# Patient Record
Sex: Female | Born: 1955 | Race: Black or African American | Hispanic: No | Marital: Married | Smoking: Never smoker
Health system: Southern US, Community
[De-identification: ages and names within clinical notes are randomized; demographics above are authoritative.]

## PROBLEM LIST (undated history)

## (undated) DIAGNOSIS — I10 Essential (primary) hypertension: Secondary | ICD-10-CM

## (undated) DIAGNOSIS — E785 Hyperlipidemia, unspecified: Secondary | ICD-10-CM

## (undated) DIAGNOSIS — E119 Type 2 diabetes mellitus without complications: Secondary | ICD-10-CM

---

## 2019-12-16 ENCOUNTER — Ambulatory Visit: Payer: Self-pay | Attending: Internal Medicine

## 2019-12-16 DIAGNOSIS — Z23 Encounter for immunization: Secondary | ICD-10-CM

## 2019-12-16 NOTE — Progress Notes (Signed)
   Covid-19 Vaccination Clinic  Name:  Brooke Miles    MRN: [id number] DOB: June 05, 1956  12/16/2019  Brooke Miles was observed post Covid-19 immunization for 15 minutes without incident. She was provided with Vaccine Information Sheet and instruction to access the V-Safe system.   Brooke Miles was instructed to call 911 with any severe reactions post vaccine: Marland Kitchen Difficulty breathing  . Swelling of face and throat  . A fast heartbeat  . A bad rash all over body  . Dizziness and weakness   Immunizations Administered    Name Date Dose VIS Date Route   Pfizer COVID-19 Vaccine 12/16/2019 11:27 AM 0.3 mL 09/20/2018 Intramuscular   Manufacturer: [employer], [employer]   Lot: [id number]   NDC: [address]-8

## 2020-01-06 ENCOUNTER — Ambulatory Visit: Payer: Self-pay | Attending: Internal Medicine

## 2020-01-06 DIAGNOSIS — Z23 Encounter for immunization: Secondary | ICD-10-CM

## 2020-01-06 NOTE — Progress Notes (Signed)
   Covid-19 Vaccination Clinic  Name:  Brooke Miles    MRN: [id number] DOB: 06-Oct-1955  01/06/2020  [name] was observed post Covid-19 immunization for 15 minutes without incident. She was provided with Vaccine Information Sheet and instruction to access the V-Safe system.   [name] was instructed to call 911 with any severe reactions post vaccine: Marland Kitchen Difficulty breathing  . Swelling of face and throat  . A fast heartbeat  . A bad rash all over body  . Dizziness and weakness   Immunizations Administered    Name Date Dose VIS Date Route   Pfizer COVID-19 Vaccine 01/06/2020 11:45 AM 0.3 mL 09/20/2018 Intramuscular   Manufacturer: [employer], [employer]   Lot: [id number]   NDC: [address]-9

## 2021-04-25 ENCOUNTER — Emergency Department: Payer: Self-pay

## 2021-04-25 ENCOUNTER — Inpatient Hospital Stay
Admission: EM | Admit: 2021-04-25 | Discharge: 2021-04-29 | DRG: 394 | Disposition: A | Discharge status: Home / Self Care | Payer: Self-pay | Attending: Family Medicine | Admitting: Family Medicine

## 2021-04-25 ENCOUNTER — Other Ambulatory Visit: Payer: Self-pay

## 2021-04-25 DIAGNOSIS — R7989 Other specified abnormal findings of blood chemistry: Secondary | ICD-10-CM | POA: Diagnosis present

## 2021-04-25 DIAGNOSIS — E1159 Type 2 diabetes mellitus with other circulatory complications: Secondary | ICD-10-CM | POA: Diagnosis present

## 2021-04-25 DIAGNOSIS — Z20822 Contact with and (suspected) exposure to covid-19: Secondary | ICD-10-CM | POA: Diagnosis present

## 2021-04-25 DIAGNOSIS — Z881 Allergy status to other antibiotic agents status: Secondary | ICD-10-CM

## 2021-04-25 DIAGNOSIS — A419 Sepsis, unspecified organism: Secondary | ICD-10-CM

## 2021-04-25 DIAGNOSIS — E1169 Type 2 diabetes mellitus with other specified complication: Secondary | ICD-10-CM | POA: Diagnosis present

## 2021-04-25 DIAGNOSIS — I248 Other forms of acute ischemic heart disease: Secondary | ICD-10-CM | POA: Diagnosis present

## 2021-04-25 DIAGNOSIS — E876 Hypokalemia: Secondary | ICD-10-CM | POA: Diagnosis present

## 2021-04-25 DIAGNOSIS — I429 Cardiomyopathy, unspecified: Secondary | ICD-10-CM | POA: Diagnosis present

## 2021-04-25 DIAGNOSIS — E860 Dehydration: Secondary | ICD-10-CM | POA: Diagnosis present

## 2021-04-25 DIAGNOSIS — K559 Vascular disorder of intestine, unspecified: Principal | ICD-10-CM | POA: Diagnosis present

## 2021-04-25 DIAGNOSIS — J189 Pneumonia, unspecified organism: Secondary | ICD-10-CM

## 2021-04-25 DIAGNOSIS — R6520 Severe sepsis without septic shock: Secondary | ICD-10-CM

## 2021-04-25 DIAGNOSIS — I152 Hypertension secondary to endocrine disorders: Secondary | ICD-10-CM | POA: Diagnosis present

## 2021-04-25 DIAGNOSIS — I4891 Unspecified atrial fibrillation: Secondary | ICD-10-CM | POA: Diagnosis present

## 2021-04-25 DIAGNOSIS — I5022 Chronic systolic (congestive) heart failure: Secondary | ICD-10-CM | POA: Diagnosis present

## 2021-04-25 DIAGNOSIS — E785 Hyperlipidemia, unspecified: Secondary | ICD-10-CM | POA: Diagnosis present

## 2021-04-25 DIAGNOSIS — G9341 Metabolic encephalopathy: Secondary | ICD-10-CM | POA: Diagnosis present

## 2021-04-25 DIAGNOSIS — N390 Urinary tract infection, site not specified: Secondary | ICD-10-CM | POA: Diagnosis present

## 2021-04-25 DIAGNOSIS — E1165 Type 2 diabetes mellitus with hyperglycemia: Secondary | ICD-10-CM | POA: Diagnosis present

## 2021-04-25 DIAGNOSIS — E872 Acidosis, unspecified: Secondary | ICD-10-CM | POA: Diagnosis present

## 2021-04-25 DIAGNOSIS — E871 Hypo-osmolality and hyponatremia: Secondary | ICD-10-CM | POA: Diagnosis present

## 2021-04-25 DIAGNOSIS — I2510 Atherosclerotic heart disease of native coronary artery without angina pectoris: Secondary | ICD-10-CM | POA: Diagnosis present

## 2021-04-25 DIAGNOSIS — E119 Type 2 diabetes mellitus without complications: Secondary | ICD-10-CM

## 2021-04-25 DIAGNOSIS — R93429 Abnormal radiologic findings on diagnostic imaging of unspecified kidney: Secondary | ICD-10-CM | POA: Diagnosis present

## 2021-04-25 DIAGNOSIS — K529 Noninfective gastroenteritis and colitis, unspecified: Secondary | ICD-10-CM

## 2021-04-25 DIAGNOSIS — I083 Combined rheumatic disorders of mitral, aortic and tricuspid valves: Secondary | ICD-10-CM | POA: Diagnosis present

## 2021-04-25 HISTORY — DX: Type 2 diabetes mellitus without complications: E11.9

## 2021-04-25 HISTORY — DX: Essential (primary) hypertension: I10

## 2021-04-25 HISTORY — DX: Hyperlipidemia, unspecified: E78.5

## 2021-04-25 LAB — COMPREHENSIVE METABOLIC PANEL
ALT: 17 U/L (ref 0–44)
AST: 31 U/L (ref 15–41)
Albumin: 3.8 g/dL (ref 3.5–5.0)
Alkaline Phosphatase: 67 U/L (ref 38–126)
Anion gap: 15 (ref 5–15)
BUN: 27 mg/dL — ABNORMAL HIGH (ref 8–23)
CO2: 29 mmol/L (ref 22–32)
Calcium: 10.2 mg/dL (ref 8.9–10.3)
Chloride: 88 mmol/L — ABNORMAL LOW (ref 98–111)
Creatinine, Ser: 1.03 mg/dL — ABNORMAL HIGH (ref 0.44–1.00)
GFR, Estimated: >60 mL/min (ref >60)
Glucose, Bld: 317 mg/dL — ABNORMAL HIGH (ref 70–99)
Potassium: 2.8 mmol/L — ABNORMAL LOW (ref 3.5–5.1)
Sodium: 132 mmol/L — ABNORMAL LOW (ref 135–145)
Total Bilirubin: 1.2 mg/dL (ref 0.3–1.2)
Total Protein: 8.4 g/dL — ABNORMAL HIGH (ref 6.5–8.1)

## 2021-04-25 LAB — CBC
HCT: 47.4 % — ABNORMAL HIGH (ref 36.0–46.0)
Hemoglobin: 16.9 g/dL — ABNORMAL HIGH (ref 12.0–15.0)
MCH: 29.1 pg (ref 26.0–34.0)
MCHC: 35.7 g/dL (ref 30.0–36.0)
MCV: 81.7 fL (ref 80.0–100.0)
Platelets: 402 10*3/uL — ABNORMAL HIGH (ref 150–400)
RBC: 5.8 MIL/uL — ABNORMAL HIGH (ref 3.87–5.11)
RDW: 13.6 % (ref 11.5–15.5)
WBC: 16.2 10*3/uL — ABNORMAL HIGH (ref 4.0–10.5)
nRBC: 0 % (ref 0.0–0.2)

## 2021-04-25 LAB — TROPONIN I (HIGH SENSITIVITY): Troponin I (High Sensitivity): 59 ng/L — ABNORMAL HIGH (ref <18)

## 2021-04-25 LAB — LIPASE, BLOOD: Lipase: 36 U/L (ref 11–51)

## 2021-04-25 LAB — RESP PANEL BY RT-PCR (FLU A&B, COVID) ARPGX2
Influenza A by PCR: NEGATIVE
Influenza B by PCR: NEGATIVE
SARS Coronavirus 2 by RT PCR: NEGATIVE

## 2021-04-25 LAB — PROTIME-INR
INR: 1.2 (ref 0.8–1.2)
Prothrombin Time: 15 seconds (ref 11.4–15.2)

## 2021-04-25 LAB — GLUCOSE, CAPILLARY: Glucose-Capillary: 224 mg/dL — ABNORMAL HIGH (ref 70–99)

## 2021-04-25 LAB — T4, FREE: Free T4: 1.13 ng/dL — ABNORMAL HIGH (ref 0.61–1.12)

## 2021-04-25 LAB — MAGNESIUM: Magnesium: 1.5 mg/dL — ABNORMAL LOW (ref 1.7–2.4)

## 2021-04-25 LAB — LACTIC ACID, PLASMA
Lactic Acid, Venous: 2.9 mmol/L (ref 0.5–1.9)
Lactic Acid, Venous: 2.2 mmol/L (ref 0.5–1.9)

## 2021-04-25 LAB — APTT: aPTT: 27 seconds (ref 24–36)

## 2021-04-25 LAB — TSH: TSH: 0.842 u[IU]/mL (ref 0.350–4.500)

## 2021-04-25 MED ORDER — ONDANSETRON HCL 4 MG PO TABS: 4.0000 mg | ORAL_TABLET | Freq: Four times a day (QID) | ORAL | Status: DC | PRN

## 2021-04-25 MED ORDER — SODIUM CHLORIDE 0.9% FLUSH
3.0000 mL | Freq: Two times a day (BID) | INTRAVENOUS | Status: DC
  Administered 2021-04-26 – 2021-04-29 (×6): 3 mL via INTRAVENOUS

## 2021-04-25 MED ORDER — POTASSIUM CHLORIDE IN NACL 40-0.9 MEQ/L-% IV SOLN
INTRAVENOUS | Status: AC
  Filled 2021-04-25 (×3): qty 1000

## 2021-04-25 MED ORDER — IOHEXOL 350 MG/ML SOLN
75.0000 mL | Freq: Once | INTRAVENOUS | Status: AC | PRN
  Administered 2021-04-25: 75 mL via INTRAVENOUS

## 2021-04-25 MED ORDER — METOPROLOL TARTRATE 5 MG/5ML IV SOLN
5.0000 mg | Freq: Four times a day (QID) | INTRAVENOUS | Status: DC | PRN
  Administered 2021-04-26: 5 mg via INTRAVENOUS
  Filled 2021-04-25: qty 5

## 2021-04-25 MED ORDER — POTASSIUM CHLORIDE 10 MEQ/100ML IV SOLN
10.0000 meq | INTRAVENOUS | Status: AC
Start: 2021-04-25 — End: 2021-04-26
  Administered 2021-04-25 – 2021-04-26 (×2): 10 meq via INTRAVENOUS
  Filled 2021-04-25 (×2): qty 100

## 2021-04-25 MED ORDER — SODIUM CHLORIDE 0.9 % IV BOLUS
1000.0000 mL | Freq: Once | INTRAVENOUS | Status: AC
  Administered 2021-04-25: 1000 mL via INTRAVENOUS

## 2021-04-25 MED ORDER — FENTANYL CITRATE PF 50 MCG/ML IJ SOSY
50.0000 ug | PREFILLED_SYRINGE | Freq: Once | INTRAMUSCULAR | Status: DC
  Filled 2021-04-25: qty 1

## 2021-04-25 MED ORDER — MAGNESIUM SULFATE 2 GM/50ML IV SOLN
2.0000 g | Freq: Once | INTRAVENOUS | Status: AC
  Administered 2021-04-25: 2 g via INTRAVENOUS
  Filled 2021-04-25: qty 50

## 2021-04-25 MED ORDER — HEPARIN (PORCINE) 25000 UT/250ML-% IV SOLN
1250.0000 [IU]/h | INTRAVENOUS | Status: DC
  Administered 2021-04-25: 1250 [IU]/h via INTRAVENOUS
  Filled 2021-04-25: qty 250

## 2021-04-25 MED ORDER — ALBUMIN HUMAN 25 % IV SOLN
25.0000 g | Freq: Once | INTRAVENOUS | Status: DC
  Filled 2021-04-25 (×2): qty 100

## 2021-04-25 MED ORDER — FENTANYL CITRATE PF 50 MCG/ML IJ SOSY
25.0000 ug | PREFILLED_SYRINGE | Freq: Once | INTRAMUSCULAR | Status: AC
  Administered 2021-04-25: 25 ug via INTRAVENOUS
  Filled 2021-04-25: qty 1

## 2021-04-25 MED ORDER — ONDANSETRON HCL 4 MG/2ML IJ SOLN
4.0000 mg | Freq: Four times a day (QID) | INTRAMUSCULAR | Status: DC | PRN
  Administered 2021-04-28: 4 mg via INTRAVENOUS
  Filled 2021-04-25: qty 2

## 2021-04-25 MED ORDER — CEFTRIAXONE SODIUM 2 G IJ SOLR
2.0000 g | INTRAMUSCULAR | Status: DC
  Filled 2021-04-25: qty 20

## 2021-04-25 MED ORDER — HEPARIN BOLUS VIA INFUSION
5000.0000 [IU] | Freq: Once | INTRAVENOUS | Status: AC
  Administered 2021-04-25: 5000 [IU] via INTRAVENOUS
  Filled 2021-04-25: qty 5000

## 2021-04-25 MED ORDER — MORPHINE SULFATE (PF) 2 MG/ML IV SOLN: 1.0000 mg | INTRAVENOUS | Status: DC | PRN

## 2021-04-25 MED ORDER — SODIUM CHLORIDE 0.9 % IV BOLUS
500.0000 mL | Freq: Once | INTRAVENOUS | Status: AC
  Administered 2021-04-25: 500 mL via INTRAVENOUS

## 2021-04-25 MED ORDER — DILTIAZEM HCL 25 MG/5ML IV SOLN
5.0000 mg | Freq: Once | INTRAVENOUS | Status: AC
  Administered 2021-04-25: 5 mg via INTRAVENOUS
  Filled 2021-04-25: qty 5

## 2021-04-25 MED ORDER — SODIUM CHLORIDE 0.9 % IV SOLN
INTRAVENOUS | Status: DC | PRN
  Administered 2021-04-25: 250 mL via INTRAVENOUS

## 2021-04-25 MED ORDER — INSULIN ASPART 100 UNIT/ML IJ SOLN
0.0000 [IU] | INTRAMUSCULAR | Status: DC
  Administered 2021-04-25: 5 [IU] via SUBCUTANEOUS
  Administered 2021-04-26: 2 [IU] via SUBCUTANEOUS
  Administered 2021-04-26 (×2): 5 [IU] via SUBCUTANEOUS
  Administered 2021-04-26: 3 [IU] via SUBCUTANEOUS
  Administered 2021-04-26: 5 [IU] via SUBCUTANEOUS
  Administered 2021-04-26: 2 [IU] via SUBCUTANEOUS
  Administered 2021-04-27: 11 [IU] via SUBCUTANEOUS
  Administered 2021-04-27 (×3): 2 [IU] via SUBCUTANEOUS
  Administered 2021-04-27: 8 [IU] via SUBCUTANEOUS
  Filled 2021-04-25 (×12): qty 1

## 2021-04-25 MED ORDER — SODIUM CHLORIDE 0.9 % IV SOLN
500.0000 mg | INTRAVENOUS | Status: DC
  Filled 2021-04-25: qty 500

## 2021-04-25 MED ORDER — DILTIAZEM HCL 60 MG PO TABS
60.0000 mg | ORAL_TABLET | Freq: Once | ORAL | Status: AC
  Administered 2021-04-25: 60 mg via ORAL
  Filled 2021-04-25: qty 1

## 2021-04-25 MED ORDER — PIPERACILLIN-TAZOBACTAM 3.375 G IVPB
3.3750 g | Freq: Three times a day (TID) | INTRAVENOUS | Status: DC
  Administered 2021-04-25 – 2021-04-26 (×2): 3.375 g via INTRAVENOUS
  Filled 2021-04-25 (×2): qty 50

## 2021-04-25 MED ORDER — POTASSIUM CHLORIDE 10 MEQ/100ML IV SOLN
10.0000 meq | INTRAVENOUS | Status: AC
  Administered 2021-04-25 (×2): 10 meq via INTRAVENOUS
  Filled 2021-04-25 (×2): qty 100

## 2021-04-25 MED ORDER — PIPERACILLIN-TAZOBACTAM 3.375 G IVPB 30 MIN
3.3750 g | Freq: Once | INTRAVENOUS | Status: AC
  Administered 2021-04-25: 3.375 g via INTRAVENOUS
  Filled 2021-04-25: qty 50

## 2021-04-25 MED ORDER — ONDANSETRON HCL 4 MG/2ML IJ SOLN
4.0000 mg | Freq: Once | INTRAMUSCULAR | Status: AC
  Administered 2021-04-25: 4 mg via INTRAVENOUS
  Filled 2021-04-25: qty 2

## 2021-04-25 NOTE — Consult Note (Signed)
CODE SEPSIS - PHARMACY COMMUNICATION  **Broad Spectrum Antibiotics should be administered within 1 hour of Sepsis diagnosis**  Time Code Sepsis Called/Page Received: 1722  Antibiotics Ordered:  Azithromycin ceftriaxone  Time of 1st antibiotic administration: after 1hr  Additional action taken by pharmacy: messaged RN  If necessary, Name of Provider/Nurse Contacted: K Black   Brooke Miles PharmD, BCPS 04/25/2021 5:51 PM

## 2021-04-25 NOTE — ED Provider Notes (Signed)
[facility] Emergency Department Provider Note  ____________________________________________   Event Date/Time   First MD Initiated Contact with Patient 04/25/21 1501     (approximate)  I have reviewed the triage vital signs and the nursing notes.   HISTORY  Chief Complaint Abdominal Pain    HPI Brooke Miles is a 65 y.o. female with diabetes who comes in with concerns for upper abdominal pain, nausea, vomiting since yesterday morning.  Patient comes in with her daughter in law who would prefer to translate for patient.  Patient reportedly has had some abdominal pain that started yesterday.  Initially was in the upper abdomen but now down to the lower abdomen as well, severe, constant, nothing makes it better or worse.  Associated with 2 episodes of nonbloody nonbilious vomiting.  Denies any diarrhea.  Denies any chest pain or shortness of breath associated with it.  Denies any history of elevated heart rates.  Has only had a prior C-section.            Past Medical History:  Diagnosis Date   Diabetes mellitus without complication (HCC)    Hyperlipidemia    Hypertension     There are no problems to display for this patient.   Past Surgical History:  Procedure Laterality Date   CESAREAN SECTION      Prior to Admission medications   Not on File    Allergies Quinolones  No family history on file.  Social History Social History   Tobacco Use   Smoking status: Never   Smokeless tobacco: Never  Substance Use Topics   Alcohol use: Not Currently      Review of Systems Constitutional: No fever/chills Eyes: No visual changes. ENT: No sore throat. Cardiovascular: Denies chest pain. Respiratory: Denies shortness of breath. Gastrointestinal: Abdominal pain, nausea, vomiting Genitourinary: Negative for dysuria. Musculoskeletal: Negative for back pain. Skin: Negative for rash. Neurological: Negative for headaches, focal weakness or  numbness. All other ROS negative ____________________________________________   PHYSICAL EXAM:  VITAL SIGNS: ED Triage Vitals  Enc Vitals Group     BP 04/25/21 1234 (!) 145/95     Pulse Rate 04/25/21 1234 (!) 58     Resp 04/25/21 1234 16     Temp 04/25/21 1235 97.7 F (36.5 C)     Temp Source 04/25/21 1235 Oral     SpO2 04/25/21 1234 95 %     Weight 04/25/21 1236 220 lb (99.8 kg)     Height 04/25/21 1236 5\' 8"  (1.727 m)     Head Circumference --      Peak Flow --      Pain Score 04/25/21 1236 5     Pain Loc --      Pain Edu? --      Excl. in GC? --     Constitutional: Alert and oriented. Well appearing and in no acute distress. Eyes: Conjunctivae are normal. EOMI. Head: Atraumatic. Nose: No congestion/rhinnorhea. Mouth/Throat: Mucous membranes are moist.   Neck: No stridor. Trachea Midline. FROM Cardiovascular: Irregular, tachycardic grossly normal heart sounds.  Good peripheral circulation. Respiratory: Normal respiratory effort.  No retractions. Lungs CTAB. Gastrointestinal: Tender in the abdomen.  Most in the upper but also a little bit in the right lower.  No distention. No abdominal bruits.  Musculoskeletal: No lower extremity tenderness nor edema.  No joint effusions. Neurologic:  Normal speech and language. No gross focal neurologic deficits are appreciated.  Skin:  Skin is warm, dry and intact. No rash  noted. Psychiatric: Mood and affect are normal. Speech and behavior are normal. GU: Deferred   ____________________________________________   LABS (all labs ordered are listed, but only abnormal results are displayed)  Labs Reviewed  COMPREHENSIVE METABOLIC PANEL - Abnormal; Notable for the following components:      Result Value   Sodium 132 (*)    Potassium 2.8 (*)    Chloride 88 (*)    Glucose, Bld 317 (*)    BUN 27 (*)    Creatinine, Ser 1.03 (*)    Total Protein 8.4 (*)    All other components within normal limits  CBC - Abnormal; Notable for the  following components:   WBC 16.2 (*)    RBC 5.80 (*)    Hemoglobin 16.9 (*)    HCT 47.4 (*)    Platelets 402 (*)    All other components within normal limits  URINALYSIS, COMPLETE (UACMP) WITH MICROSCOPIC - Abnormal; Notable for the following components:   Color, Urine YELLOW (*)    APPearance HAZY (*)    Glucose, UA 150 (*)    Hgb urine dipstick SMALL (*)    Ketones, ur 5 (*)    Protein, ur >=300 (*)    All other components within normal limits  LIPASE, BLOOD   ____________________________________________   ED ECG REPORT I, Concha Se, the attending physician, personally viewed and interpreted this ECG.  A. fib with a rate of 128, no ST elevation, T wave inversions aVL, T4-T6, normal intervals   ____________________________________________  RADIOLOGY   Official radiology report(s): CT Angio Chest PE W and/or Wo Contrast  Result Date: 04/25/2021 CLINICAL DATA:  PE suspected EXAM: CT ANGIOGRAPHY CHEST WITH CONTRAST TECHNIQUE: Multidetector CT imaging of the chest was performed using the standard protocol during bolus administration of intravenous contrast. Multiplanar CT image reconstructions and MIPs were obtained to evaluate the vascular anatomy. CONTRAST:  61mL OMNIPAQUE IOHEXOL 350 MG/ML SOLN COMPARISON:  None. FINDINGS: Cardiovascular: Satisfactory opacification of the pulmonary arteries to the segmental level. No evidence of pulmonary embolism. Cardiomegaly. Left coronary artery calcifications. No pericardial effusion. Mediastinum/Nodes: No enlarged mediastinal, hilar, or axillary lymph nodes. Thyroid gland, trachea, and esophagus demonstrate no significant findings. Lungs/Pleura: Nonspecific infectious or inflammatory ground-glass airspace disease of the right lung base (series 7, image 57). No pleural effusion or pneumothorax. Upper Abdomen: Please see separately reported examination of the abdomen and pelvis. Musculoskeletal: No chest wall abnormality. No acute or  significant osseous findings. Review of the MIP images confirms the above findings. IMPRESSION: 1. Negative examination for pulmonary embolism. 2. Nonspecific infectious or inflammatory ground-glass airspace disease of the right lung base. 3. Cardiomegaly and coronary artery disease. Electronically Signed   By: Jearld Lesch M.D.   On: 04/25/2021 17:04   CT ABDOMEN PELVIS W CONTRAST  Result Date: 04/25/2021 CLINICAL DATA:  Abdominal distension, upper abdominal pain, nausea, vomiting EXAM: CT ABDOMEN AND PELVIS WITH CONTRAST TECHNIQUE: Multidetector CT imaging of the abdomen and pelvis was performed using the standard protocol following bolus administration of intravenous contrast. CONTRAST:  41mL OMNIPAQUE IOHEXOL 350 MG/ML SOLN COMPARISON:  None. FINDINGS: Lower chest: No acute abnormality. Hepatobiliary: No solid liver abnormality is seen. No gallstones, gallbladder wall thickening, or biliary dilatation. Pancreas: Unremarkable. No pancreatic ductal dilatation or surrounding inflammatory changes. Spleen: Normal in size without significant abnormality. Adrenals/Urinary Tract: Adrenal glands are unremarkable. There are multiple hypoenhancing defects of the inferior pole of the right kidney (series 6, image 53). The left kidney is normal, without renal  calculi, solid lesion, or hydronephrosis. Bladder is unremarkable. Stomach/Bowel: Stomach is within normal limits. Appendix appears normal. There are multiple thickened loops of mid to distal small bowel in the low abdomen and pelvis (series 3, image 77). Notably, the terminal ileum is spared. Vascular/Lymphatic: Aortic atherosclerosis. No enlarged abdominal or pelvic lymph nodes. Reproductive: No mass or other significant abnormality. Other: No abdominal wall hernia or abnormality. No abdominopelvic ascites. Musculoskeletal: No acute or significant osseous findings. IMPRESSION: 1. There are multiple thickened loops of mid to distal small bowel in the low abdomen  and pelvis. Notably, the terminal ileum is spared. Findings are consistent with nonspecific infectious, inflammatory, or ischemic enteritis. 2. There are multiple hypoenhancing defects of the inferior pole of the right kidney, this appearance suggesting either pyelonephritis or perhaps infarction. Correlate with urinalysis. 3. Thromboembolic shower is a general differential consideration that could produce right kidney findings and small-bowel findings, and central source could be further investigated by echocardiography if desired. Aortic Atherosclerosis (ICD10-I70.0). Electronically Signed   By: Jearld Lesch M.D.   On: 04/25/2021 17:10    ____________________________________________   PROCEDURES  Procedure(s) performed (including Critical Care):  .1-3 Lead EKG Interpretation Performed by: Concha Se, MD Authorized by: Concha Se, MD     Interpretation: abnormal     ECG rate:  100s   ECG rate assessment: tachycardic     Rhythm: atrial fibrillation     Ectopy: none     Conduction: normal   .Critical Care Performed by: Concha Se, MD Authorized by: Concha Se, MD   Critical care provider statement:    Critical care time (minutes):  45   Critical care was necessary to treat or prevent imminent or life-threatening deterioration of the following conditions:  Sepsis and cardiac failure   Critical care was time spent personally by me on the following activities:  Discussions with consultants, evaluation of patient's response to treatment, examination of patient, ordering and performing treatments and interventions, ordering and review of laboratory studies, ordering and review of radiographic studies, pulse oximetry, re-evaluation of patient's condition, obtaining history from patient or surrogate and review of old charts   ____________________________________________   INITIAL IMPRESSION / ASSESSMENT AND PLAN / ED COURSE  Brooke Miles was evaluated in Emergency Department on  04/25/2021 for the symptoms described in the history of present illness. She was evaluated in the context of the global COVID-19 pandemic, which necessitated consideration that the patient might be at risk for infection with the SARS-CoV-2 virus that causes COVID-19. Institutional protocols and algorithms that pertain to the evaluation of patients at risk for COVID-19 are in a state of rapid change based on information released by regulatory bodies including the CDC and federal and state organizations. These policies and algorithms were followed during the patient's care in the ED.    Patient comes in with abdominal pain with nausea, vomiting.  Will get COVID test.  We will labs to evaluate for Electra abnormalities, AKI, UTI.  Patient noted to be in A. fib with RVR on initial EKG.  Once we placed her on the monitor her heart rates were lower in the 110s.  We will get a CT PE given patient does have some lower oxygen levels 90 to 93% given new onset A. fib as well as CT abdomen to make sure no evidence of appendicitis, perforation, cholecystitis or other acute pathology.   Labs show elevated white count of 16 making concern for an infection although LFTs and lipase  are normal which is reassuring.  Patient does look dehydrated with low sodium and low chloride and patient starting some fluids for that.  Also potassium is significantly low at 2.8 we will give some IV and oral repletion.  Glucose is elevated but no evidence of DKA  For patient's atrial fibrillation she was given a small dose of IV diltiazem and given some oral diltiazem to help control her rates.   CT imaging is concerning for thromboembolic shower.  They are concerned that there could be some kidney infarction versus Pilo but her urine looks negative so I am concerned more about an infarction.  There is also concern for thickened loops of bowel that could be infectious versus inflammatory versus ischemic.  On my examination patient is feeling  better denies any blood in her stool.  Lactate was ordered to evaluate for ischemia of the bowel.  Patient will need to be started on heparin and admitted to the hospital team.   There is also concern for the potential of pneumonia on her CT scan given she meets sepsis criteria and her oxygen levels are slightly low will get blood cultures, lactate and start on ceftriaxone and azithro.  6:19 PM lactate lactate slightly elevated.  We will give a liter of fluid.  I discussed the case with Dr. Everlene Farrier from surgery due to concern for ischemia on CT. patient has a nonperitoneal exam and reports feeling a lot better in her abdomen.  At this time we will start heparin and admit to the hospital team.  7:07 PM abdomen remains soft and nontender and will admit patient to the hospital team at this time       ____________________________________________   FINAL CLINICAL IMPRESSION(S) / ED DIAGNOSES   Final diagnoses:  Sepsis, due to unspecified organism, unspecified whether acute organ dysfunction present [facility])  Atrial fibrillation with rapid ventricular response (HCC)  Hypokalemia  Hypomagnesemia  Community acquired pneumonia, unspecified laterality      MEDICATIONS GIVEN DURING THIS VISIT:  Medications  cefTRIAXone (ROCEPHIN) 2 g in sodium chloride 0.9 % 100 mL IVPB (has no administration in time range)  azithromycin (ZITHROMAX) 500 mg in sodium chloride 0.9 % 250 mL IVPB (has no administration in time range)  diltiazem (CARDIZEM) tablet 60 mg (has no administration in time range)  sodium chloride 0.9 % bolus 500 mL (0 mLs Intravenous Stopped 04/25/21 1441)  potassium chloride 10 mEq in 100 mL IVPB (10 mEq Intravenous New Bag/Given 04/25/21 1711)  ondansetron (ZOFRAN) injection 4 mg (4 mg Intravenous Given 04/25/21 1543)  fentaNYL (SUBLIMAZE) injection 25 mcg (25 mcg Intravenous Given 04/25/21 1544)  sodium chloride 0.9 % bolus 500 mL (500 mLs Intravenous New Bag/Given 04/25/21 1705)   magnesium sulfate IVPB 2 g 50 mL (2 g Intravenous New Bag/Given 04/25/21 1705)  iohexol (OMNIPAQUE) 350 MG/ML injection 75 mL (75 mLs Intravenous Contrast Given 04/25/21 1641)  diltiazem (CARDIZEM) injection 5 mg (5 mg Intravenous Given 04/25/21 1735)     ED Discharge Orders     None        Note:  This document was prepared using Dragon voice recognition software and may include unintentional dictation errors.    Concha Se, MD 04/25/21 [id number]

## 2021-04-25 NOTE — ED Notes (Signed)
Patient to CT at this time

## 2021-04-25 NOTE — ED Triage Notes (Signed)
Pt c/o mid to upper abd pain with N/V since yesterday morning. Denies diarrhea. Pt is in NAD at present.

## 2021-04-25 NOTE — Progress Notes (Signed)
Pharmacy Antibiotic Note  Brooke Miles is a 65 y.o. female admitted on 04/25/2021 with  intraabdominal infection .  Pharmacy has been consulted for Zosyn dosing.  Plan: Zosyn 3.375g IV q8h (4 hour infusion).  Height: 5\' 8"  (172.7 cm) Weight: 99.8 kg (220 lb) IBW/kg (Calculated) : 63.9  Temp (24hrs), Avg:97.8 F (36.6 C), Min:97.7 F (36.5 C), Max:97.9 F (36.6 C)  Recent Labs  Lab 04/25/21 1310 04/25/21 1741  WBC 16.2*  --   CREATININE 1.03*  --   LATICACIDVEN  --  2.9*    Estimated Creatinine Clearance: 68.2 mL/min (A) (by C-G formula based on SCr of 1.03 mg/dL (H)).    Allergies  Allergen Reactions   Quinolones Hives and Itching    Antimicrobials this admission: Zosyn 9/30 >>   Dose adjustments this admission:  Microbiology results:   Thank you for allowing pharmacy to be a part of this patient's care.  10/30, PharmD, BCPS 04/25/2021 8:18 PM

## 2021-04-25 NOTE — Progress Notes (Signed)
ANTICOAGULATION CONSULT NOTE - Initial Consult  Pharmacy Consult for Heparin Drip Indication: atrial fibrillation and possible heart thrombus  Allergies  Allergen Reactions   Quinolones Hives and Itching    Patient Measurements: Height: 5\' 8"  (172.7 cm) Weight: 99.8 kg (220 lb) IBW/kg (Calculated) : 63.9 Heparin Dosing Weight: 85.8 kg  Vital Signs: Temp: 97.7 F (36.5 C) (09/30 1235) Temp Source: Oral (09/30 1235) BP: 123/85 (09/30 1830) Pulse Rate: 83 (09/30 1830)  Labs: Recent Labs    04/25/21 1310  HGB 16.9*  HCT 47.4*  PLT 402*  CREATININE 1.03*  TROPONINIHS 59*    Estimated Creatinine Clearance: 68.2 mL/min (A) (by C-G formula based on SCr of 1.03 mg/dL (H)).   Medical History: Past Medical History:  Diagnosis Date   Diabetes mellitus without complication (HCC)    Hyperlipidemia    Hypertension     Assessment: Patient is a 65yo female admitted with abdominal pain. Patient found to be in afib and CT is concerning for thromboembolic shower. Pharmacy consulted for Heparin dosing.  Goal of Therapy:  Heparin level 0.3-0.7 units/ml Monitor platelets by anticoagulation protocol: Yes   Plan:  Give 5000 units bolus x 1 Start heparin infusion at 1250 units/hr Check anti-Xa level in 6 hours and daily while on heparin Continue to monitor H&H and platelets  65yo, PharmD, BCPS 04/25/2021 7:13 PM

## 2021-04-25 NOTE — ED Notes (Signed)
Lab called at this time to add on PT and INR.

## 2021-04-25 NOTE — H&P (Signed)
History and Physical    Brooke Miles RUE:[id number] DOB: Apr 08, 1956 DOA: 04/25/2021  PCP: Pcp, No  Patient coming from: Home  I have personally briefly reviewed patient's old medical records in [facility]  Chief Complaint: Abdominal pain, nausea, vomiting  HPI: Chrisha Miles is a 65 y.o. female with medical history significant for type 2 diabetes, hypertension who presented to the ED for evaluation of abdominal pain with nausea and vomiting.  Patient reports new onset of pain across her upper abdomen associated with nausea and vomiting beginning yesterday.  She has not been able to maintain adequate oral intake.  She has had subjective fevers, chills, diaphoresis, and headache.  She denies any diarrhea.  She reports occasional chest discomfort which has been ongoing for a long time.  ED Course:  Initial vitals showed BP 145/95, pulse 58, RR 16, temp 97.7 F.  While in the ED patient was noted to have A. fib with RVR, rate up to 128.  She was tachypneic with RR up to 30.  Labs significant for sodium 132, potassium 2.8, magnesium 1.5, chloride 88, bicarb 29, BUN 27, creatinine 1.03, serum glucose 317, WBC 16.2, hemoglobin 16.9, platelets 402,000, high-sensitivity troponin 59, TSH 0.842, free T4 1.13, lactic acid 2.9, INR 1.2.  Urinalysis showed >300 protein, 5 ketones, negative nitrates, negative leukocytes.  Blood and urine cultures obtained and pending.  SARS-CoV-2 and influenza PCR's are negative.  CTA chest PE study is negative for evidence of PE.  Nonspecific infectious or inflammatory groundglass airspace disease of the right lung base noted.  Cardiomegaly and left coronary artery calcifications noted.  CT abdomen/pelvis with contrast shows multiple thickened loops of mid to distal small bowel in the lower abdomen and pelvis sparing the terminal ileum.  Findings consistent with nonspecific enteritis.  Multiple hypoenhancing defects of the inferior pole of the right kidney are seen  suggesting either pyelonephritis or infarction.  Thromboembolic shower is a general differential consideration that can produce the findings per radiology read.  Patient was given 2 L normal saline, IV magnesium 2 g, IV K 10 mEq x 2, IV diltiazem 5 mg once followed by oral diltiazem 60 mg, IV Zosyn, Zofran, fentanyl, IV albumin 25 g, and started on IV heparin.  General surgery was consulted and recommended continued medical management without need for emergent surgical intervention at this time.  The hospitalist service was consulted to admit for further evaluation and management.  Review of Systems: All systems reviewed and are negative except as documented in history of present illness above.   Past Medical History:  Diagnosis Date   Diabetes mellitus without complication (HCC)    Hyperlipidemia    Hypertension     Past Surgical History:  Procedure Laterality Date   CESAREAN SECTION      Social History:  reports that she has never smoked. She has never used smokeless tobacco. She reports that she does not currently use alcohol. No history on file for drug use.  Allergies  Allergen Reactions   Quinolones Hives and Itching    No family history on file.   Prior to Admission medications   Not on File    Physical Exam: Vitals:   04/25/21 1600 04/25/21 1730 04/25/21 1817 04/25/21 1830  BP: (!) 116/93 (!) 139/95 (!) 139/102 123/85  Pulse: (!) 107 63  83  Resp: (!) 23 (!) 25  19  Temp:      TempSrc:      SpO2: 90% 100%  93%  Weight:  Height:       Constitutional: Resting in bed, NAD, calm, comfortable Eyes: PERRL, lids and conjunctivae normal ENMT: Mucous membranes are moist. Posterior pharynx clear of any exudate or lesions.Normal dentition.  Neck: normal, supple, no masses. Respiratory: clear to auscultation bilaterally, no wheezing, no crackles. Normal respiratory effort. No accessory muscle use.  Cardiovascular: Irregularly irregular, no murmurs / rubs /  gallops. No extremity edema. 2+ pedal pulses. Abdomen: Mild upper abdominal tenderness, no masses palpated. No hepatosplenomegaly. Bowel sounds diminished.  Musculoskeletal: no clubbing / cyanosis. No joint deformity upper and lower extremities. Good ROM, no contractures. Normal muscle tone.  Skin: no rashes, lesions, ulcers. No induration Neurologic: CN 2-12 grossly intact. Sensation intact. Strength 5/5 in all 4.  Psychiatric: Normal judgment and insight. Alert and oriented x 3. Normal mood.   Labs on Admission: I have personally reviewed following labs and imaging studies  CBC: Recent Labs  Lab 04/25/21 1310  WBC 16.2*  HGB 16.9*  HCT 47.4*  MCV 81.7  PLT 402*   Basic Metabolic Panel: Recent Labs  Lab 04/25/21 1310  NA 132*  K 2.8*  CL 88*  CO2 29  GLUCOSE 317*  BUN 27*  CREATININE 1.03*  CALCIUM 10.2  MG 1.5*   GFR: Estimated Creatinine Clearance: 68.2 mL/min (A) (by C-G formula based on SCr of 1.03 mg/dL (H)). Liver Function Tests: Recent Labs  Lab 04/25/21 1310  AST 31  ALT 17  ALKPHOS 67  BILITOT 1.2  PROT 8.4*  ALBUMIN 3.8   Recent Labs  Lab 04/25/21 1310  LIPASE 36   No results for input(s): AMMONIA in the last 168 hours. Coagulation Profile: Recent Labs  Lab 04/25/21 1830  INR 1.2   Cardiac Enzymes: No results for input(s): CKTOTAL, CKMB, CKMBINDEX, TROPONINI in the last 168 hours. BNP (last 3 results) No results for input(s): PROBNP in the last 8760 hours. HbA1C: No results for input(s): HGBA1C in the last 72 hours. CBG: No results for input(s): GLUCAP in the last 168 hours. Lipid Profile: No results for input(s): CHOL, HDL, LDLCALC, TRIG, CHOLHDL, LDLDIRECT in the last 72 hours. Thyroid Function Tests: Recent Labs    04/25/21 1310  TSH 0.842  FREET4 1.13*   Anemia Panel: No results for input(s): VITAMINB12, FOLATE, FERRITIN, TIBC, IRON, RETICCTPCT in the last 72 hours. Urine analysis:    Component Value Date/Time   COLORURINE  YELLOW (A) 04/25/2021 1320   APPEARANCEUR HAZY (A) 04/25/2021 1320   LABSPEC 1.016 04/25/2021 1320   PHURINE 5.0 04/25/2021 1320   GLUCOSEU 150 (A) 04/25/2021 1320   HGBUR SMALL (A) 04/25/2021 1320   BILIRUBINUR NEGATIVE 04/25/2021 1320   KETONESUR 5 (A) 04/25/2021 1320   PROTEINUR >=300 (A) 04/25/2021 1320   NITRITE NEGATIVE 04/25/2021 1320   LEUKOCYTESUR NEGATIVE 04/25/2021 1320    Radiological Exams on Admission: CT Angio Chest PE W and/or Wo Contrast  Result Date: 04/25/2021 CLINICAL DATA:  PE suspected EXAM: CT ANGIOGRAPHY CHEST WITH CONTRAST TECHNIQUE: Multidetector CT imaging of the chest was performed using the standard protocol during bolus administration of intravenous contrast. Multiplanar CT image reconstructions and MIPs were obtained to evaluate the vascular anatomy. CONTRAST:  70mL OMNIPAQUE IOHEXOL 350 MG/ML SOLN COMPARISON:  None. FINDINGS: Cardiovascular: Satisfactory opacification of the pulmonary arteries to the segmental level. No evidence of pulmonary embolism. Cardiomegaly. Left coronary artery calcifications. No pericardial effusion. Mediastinum/Nodes: No enlarged mediastinal, hilar, or axillary lymph nodes. Thyroid gland, trachea, and esophagus demonstrate no significant findings. Lungs/Pleura: Nonspecific infectious or  inflammatory ground-glass airspace disease of the right lung base (series 7, image 57). No pleural effusion or pneumothorax. Upper Abdomen: Please see separately reported examination of the abdomen and pelvis. Musculoskeletal: No chest wall abnormality. No acute or significant osseous findings. Review of the MIP images confirms the above findings. IMPRESSION: 1. Negative examination for pulmonary embolism. 2. Nonspecific infectious or inflammatory ground-glass airspace disease of the right lung base. 3. Cardiomegaly and coronary artery disease. Electronically Signed   By: Jearld Lesch M.D.   On: 04/25/2021 17:04   CT ABDOMEN PELVIS W CONTRAST  Result  Date: 04/25/2021 CLINICAL DATA:  Abdominal distension, upper abdominal pain, nausea, vomiting EXAM: CT ABDOMEN AND PELVIS WITH CONTRAST TECHNIQUE: Multidetector CT imaging of the abdomen and pelvis was performed using the standard protocol following bolus administration of intravenous contrast. CONTRAST:  62mL OMNIPAQUE IOHEXOL 350 MG/ML SOLN COMPARISON:  None. FINDINGS: Lower chest: No acute abnormality. Hepatobiliary: No solid liver abnormality is seen. No gallstones, gallbladder wall thickening, or biliary dilatation. Pancreas: Unremarkable. No pancreatic ductal dilatation or surrounding inflammatory changes. Spleen: Normal in size without significant abnormality. Adrenals/Urinary Tract: Adrenal glands are unremarkable. There are multiple hypoenhancing defects of the inferior pole of the right kidney (series 6, image 53). The left kidney is normal, without renal calculi, solid lesion, or hydronephrosis. Bladder is unremarkable. Stomach/Bowel: Stomach is within normal limits. Appendix appears normal. There are multiple thickened loops of mid to distal small bowel in the low abdomen and pelvis (series 3, image 77). Notably, the terminal ileum is spared. Vascular/Lymphatic: Aortic atherosclerosis. No enlarged abdominal or pelvic lymph nodes. Reproductive: No mass or other significant abnormality. Other: No abdominal wall hernia or abnormality. No abdominopelvic ascites. Musculoskeletal: No acute or significant osseous findings. IMPRESSION: 1. There are multiple thickened loops of mid to distal small bowel in the low abdomen and pelvis. Notably, the terminal ileum is spared. Findings are consistent with nonspecific infectious, inflammatory, or ischemic enteritis. 2. There are multiple hypoenhancing defects of the inferior pole of the right kidney, this appearance suggesting either pyelonephritis or perhaps infarction. Correlate with urinalysis. 3. Thromboembolic shower is a general differential consideration that  could produce right kidney findings and small-bowel findings, and central source could be further investigated by echocardiography if desired. Aortic Atherosclerosis (ICD10-I70.0). Electronically Signed   By: Jearld Lesch M.D.   On: 04/25/2021 17:10    EKG: Personally reviewed. Atrial fibrillation, right 128.  No prior for comparison.  Assessment/Plan Principal Problem:   Severe sepsis (HCC) Active Problems:   Enteritis   Atrial fibrillation with rapid ventricular response (HCC)   Type 2 diabetes mellitus (HCC)   Hypertension associated with diabetes (HCC)   Hypokalemia   Hypomagnesemia   Gerene Ofallon is a 65 y.o. female with medical history significant for type 2 diabetes, hypertension who is admitted with severe sepsis due to enteritis found to have new A. fib with RVR.  Severe sepsis due to enteritis: Patient presenting with leukocytosis, tachypnea, tachycardia, lactic acidosis with findings of enteritis on CT.  Unclear whether infectious or ischemic enteritis. -General surgery following, appreciate assistance -Keep n.p.o. -Continue IV heparin -Continue IV Zosyn -Continue IV fluid hydration overnight, follow repeat lactic acid -Repeat abdominal x-ray in a.m. ordered -Follow blood cultures  Atrial fibrillation with RVR: Appears to be new diagnosis of unknown duration.  Rates improving with IV fluids and after receiving diltiazem.  CHA2DS2-VASc score is at least 3. -Use IV metoprolol 5 mg as needed for rate control while NPO -Continue IV heparin anticoagulation -  Obtain echocardiogram  Hypokalemia/hypomagnesemia: Initial supplementation given in the ED, give additional IV potassium and add to maintenance fluids.  Repeat labs in AM.  Hypoenhancing defects of right kidney: Kidney findings in addition to small bowel findings on CT imaging poses question of thromboembolic showering.  Continue IV heparin and follow  Elevated troponin: Troponin minimally elevated at 59, likely demand  ischemia in setting of A. fib with RVR and sepsis.  Follow repeat troponin, continue IV heparin.  Type 2 diabetes with hyperglycemia: Hyperglycemic on admission without evidence of DKA/HHS.  Place on moderate SSI q4h while NPO.  Hold metformin.  Check A1c.  Hypertension: Currently stable, home amlodipine, chlorthalidone, lisinopril on hold while NPO.  DVT prophylaxis: IV heparin Code Status: Full code Family Communication: Discussed with patient, she has discussed with family Disposition Plan: From home and likely discharge to home pending clinical progress Consults called: General surgery Level of care: Med-Surg Admission status:  Status is: Inpatient  Remains inpatient appropriate because:Ongoing diagnostic testing needed not appropriate for outpatient work up, IV treatments appropriate due to intensity of illness or inability to take PO, and Inpatient level of care appropriate due to severity of illness  Dispo: The patient is from: Home              Anticipated d/c is to: Home              Patient currently is not medically stable to d/c.   Difficult to place patient No   Darreld Mclean MD [facility]  If 7PM-7AM, please contact night-coverage [email or web address]  04/25/2021, 7:36 PM

## 2021-04-25 NOTE — Consult Note (Signed)
Patient ID: Brooke Miles, female   DOB: February 02, 1956, 65 y.o.   MRN: [id number]  HPI Brooke Miles is a 65 y.o. female seen in consultation at the request of Dr. Fuller Plan case d/w her in person. Abd pain , nausea and vomiting for 24 hrs. Pain intermittent , colicky and moderate in intensity  , no specific alleviating or aggravating factors. Daughter at bedside  who translates . She is from [place]. Prior C section. Found to have a fib. CT w Diffuse bowel inflammation. No pneumatosis no free air.  Initial a fib w RVR raises questionable embolic events.  Labs showed elevated white count of 16 making concern for an infection although LFTs and lipase are normal which is reassuring.  .  Also potassium is significantly low at 2.8   creat 1.03 and BUN 27 Glucose is elevated 317 but no evidence of DKA. INR is nml    HPI  Past Medical History:  Diagnosis Date   Diabetes mellitus without complication (HCC)    Hyperlipidemia    Hypertension     Past Surgical History:  Procedure Laterality Date   CESAREAN SECTION      No family history on file.  Social History Social History   Tobacco Use   Smoking status: Never   Smokeless tobacco: Never  Substance Use Topics   Alcohol use: Not Currently    Allergies  Allergen Reactions   Quinolones Hives and Itching    Current Facility-Administered Medications  Medication Dose Route Frequency Provider Last Rate Last Admin   albumin human 25 % solution 25 g  25 g Intravenous Once Eliezer Khawaja F, MD       heparin ADULT infusion 100 units/mL (25000 units/247mL)  1,250 Units/hr Intravenous Continuous Foye Deer, RPH       heparin bolus via infusion 5,000 Units  5,000 Units Intravenous Once Foye Deer, [facility]       No current outpatient medications on file.     Review of Systems Full ROS  was asked and was negative except for the information on the HPI  Physical Exam Blood pressure 123/85, pulse 83, temperature 97.7 F (36.5 C), temperature  source Oral, resp. rate 19, height 5\' 8"  (1.727 m), weight 99.8 kg, SpO2 93 %. CONSTITUTIONAL: NAD EYES: Pupils are equal, round, Sclera are non-icteric. EARS, NOSE, MOUTH AND THROAT: Wearing a mask.Hearing is intact to voice. LYMPH NODES:  Lymph nodes in the neck are normal. RESPIRATORY:  Lungs are clear. There is normal respiratory effort, with equal breath sounds bilaterally, and without pathologic use of accessory muscles. CARDIOVASCULAR: Heart is iregular without murmurs, gallops, or rubs. GI: The abdomen is soft, mild diffuse tenderness w/o peritonitis.. There are no palpable masses. There is no hepatosplenomegaly. There are normal bowel sounds in all quadrants. GU: Rectal deferred.   MUSCULOSKELETAL: Normal muscle strength and tone. No cyanosis or edema.   SKIN: Turgor is good and there are no pathologic skin lesions or ulcers. NEUROLOGIC: Motor and sensation is grossly normal. Cranial nerves are grossly intact. PSYCH:  Oriented to person, place and time. Affect is normal.  Data Reviewed  I have personally reviewed the patient's imaging, laboratory findings and medical records.    Assessment/Plan\\ 65 yo female w abd pain w enteritis of uknown etiology, Infectious vs ischemic. Currently not toxic , no peritonitic and no alarming signs that will mandate surgical intervention. We will continue to follow w serial abd exam and xray. Agree w NPO, heparin and fluid resuscitation and  antibiotics.   Sterling Big, MD FACS General Surgeon 04/25/2021, 7:16 PM

## 2021-04-25 NOTE — ED Notes (Signed)
Blue top sent to lab at this time 

## 2021-04-25 NOTE — Progress Notes (Signed)
Elink monitoring for sepsis protocol 

## 2021-04-25 NOTE — ED Provider Notes (Signed)
Emergency Medicine Provider Triage Evaluation Note  Brooke Miles, a 65 y.o. female  was evaluated in triage.  Pt complains of generalized  abdominal pain, nausea, vomiting. Symptoms began yesterday. No associated diarrhea/constipation   Review of Systems  Positive: Add pain Negative: CP, SOB  Physical Exam  BP (!) 145/95 (BP Location: Right Arm)   Pulse (!) 58   Temp 97.7 F (36.5 C) (Oral)   Resp 16   Ht 5\' 8"  (1.727 m)   Wt 99.8 kg   SpO2 95%   BMI 33.45 kg/m  Gen:   Awake, no distress  NAD Resp:  Normal effort CTA MSK:   Moves extremities without difficulty  Other:  CVS: tachy rate  Medical Decision Making  Medically screening exam initiated at 1:09 PM.  Appropriate orders placed.  Brooke Miles was informed that the remainder of the evaluation will be completed by another provider, this initial triage assessment does not replace that evaluation, and the importance of remaining in the ED until their evaluation is complete.  Patient with ED evaluation of abdominal pain with N/V since yesterday.   Alfredo Bach, PA-C 04/25/21 1313    04/27/21, MD 04/26/21 2245

## 2021-04-26 ENCOUNTER — Inpatient Hospital Stay: Payer: Self-pay

## 2021-04-26 ENCOUNTER — Inpatient Hospital Stay (HOSPITAL_COMMUNITY)
Admit: 2021-04-26 | Discharge: 2021-04-26 | Disposition: A | Discharge status: Home / Self Care | Payer: Self-pay | Attending: Internal Medicine | Admitting: Internal Medicine

## 2021-04-26 DIAGNOSIS — R93429 Abnormal radiologic findings on diagnostic imaging of unspecified kidney: Secondary | ICD-10-CM

## 2021-04-26 DIAGNOSIS — G9341 Metabolic encephalopathy: Secondary | ICD-10-CM

## 2021-04-26 DIAGNOSIS — I248 Other forms of acute ischemic heart disease: Secondary | ICD-10-CM | POA: Diagnosis present

## 2021-04-26 DIAGNOSIS — E871 Hypo-osmolality and hyponatremia: Secondary | ICD-10-CM | POA: Diagnosis present

## 2021-04-26 DIAGNOSIS — I4891 Unspecified atrial fibrillation: Secondary | ICD-10-CM

## 2021-04-26 DIAGNOSIS — I5022 Chronic systolic (congestive) heart failure: Secondary | ICD-10-CM | POA: Diagnosis present

## 2021-04-26 LAB — ECHOCARDIOGRAM COMPLETE
AR max vel: 2.02 cm2
AV Peak grad: 8.2 mmHg
Ao pk vel: 1.43 m/s
Area-P 1/2: 2.53 cm2
Calc EF: 39.5 %
Height: 68 in
S' Lateral: 4.1 cm
Single Plane A2C EF: 38.6 %
Single Plane A4C EF: 42 %
Weight: 3520 oz

## 2021-04-26 LAB — BASIC METABOLIC PANEL
Anion gap: 10 (ref 5–15)
BUN: 18 mg/dL (ref 8–23)
CO2: 28 mmol/L (ref 22–32)
Calcium: 8.9 mg/dL (ref 8.9–10.3)
Chloride: 97 mmol/L — ABNORMAL LOW (ref 98–111)
Creatinine, Ser: 0.83 mg/dL (ref 0.44–1.00)
GFR, Estimated: >60 mL/min (ref >60)
Glucose, Bld: 144 mg/dL — ABNORMAL HIGH (ref 70–99)
Potassium: 3.1 mmol/L — ABNORMAL LOW (ref 3.5–5.1)
Sodium: 135 mmol/L (ref 135–145)

## 2021-04-26 LAB — GLUCOSE, CAPILLARY
Glucose-Capillary: 134 mg/dL — ABNORMAL HIGH (ref 70–99)
Glucose-Capillary: 143 mg/dL — ABNORMAL HIGH (ref 70–99)
Glucose-Capillary: 183 mg/dL — ABNORMAL HIGH (ref 70–99)
Glucose-Capillary: 237 mg/dL — ABNORMAL HIGH (ref 70–99)
Glucose-Capillary: 240 mg/dL — ABNORMAL HIGH (ref 70–99)
Glucose-Capillary: 246 mg/dL — ABNORMAL HIGH (ref 70–99)

## 2021-04-26 LAB — URINALYSIS, COMPLETE (UACMP) WITH MICROSCOPIC
Bilirubin Urine: NEGATIVE
Glucose, UA: 150 mg/dL — AB
Ketones, ur: 5 mg/dL — AB
Leukocytes,Ua: NEGATIVE
Nitrite: NEGATIVE
Protein, ur: >=300 mg/dL — AB
Specific Gravity, Urine: 1.016 (ref 1.005–1.030)
pH: 5 (ref 5.0–8.0)

## 2021-04-26 LAB — CBC
HCT: 44.8 % (ref 36.0–46.0)
Hemoglobin: 14.9 g/dL (ref 12.0–15.0)
MCH: 28.8 pg (ref 26.0–34.0)
MCHC: 33.3 g/dL (ref 30.0–36.0)
MCV: 86.5 fL (ref 80.0–100.0)
Platelets: 300 10*3/uL (ref 150–400)
RBC: 5.18 MIL/uL — ABNORMAL HIGH (ref 3.87–5.11)
RDW: 13.9 % (ref 11.5–15.5)
WBC: 15.4 10*3/uL — ABNORMAL HIGH (ref 4.0–10.5)
nRBC: 0 % (ref 0.0–0.2)

## 2021-04-26 LAB — HIV ANTIBODY (ROUTINE TESTING W REFLEX): HIV Screen 4th Generation wRfx: NONREACTIVE

## 2021-04-26 LAB — PROCALCITONIN: Procalcitonin: 0.13 ng/mL

## 2021-04-26 LAB — HEPARIN LEVEL (UNFRACTIONATED)
Heparin Unfractionated: 0.5 IU/mL (ref 0.30–0.70)
Heparin Unfractionated: 0.41 IU/mL (ref 0.30–0.70)

## 2021-04-26 LAB — MAGNESIUM: Magnesium: 1.7 mg/dL (ref 1.7–2.4)

## 2021-04-26 LAB — TROPONIN I (HIGH SENSITIVITY): Troponin I (High Sensitivity): 63 ng/L — ABNORMAL HIGH (ref <18)

## 2021-04-26 LAB — LACTIC ACID, PLASMA: Lactic Acid, Venous: 4.1 mmol/L (ref 0.5–1.9)

## 2021-04-26 MED ORDER — CARVEDILOL 6.25 MG PO TABS: 6.2500 mg | ORAL_TABLET | Freq: Two times a day (BID) | ORAL | Status: DC

## 2021-04-26 MED ORDER — MAGNESIUM SULFATE IN D5W 1-5 GM/100ML-% IV SOLN
1.0000 g | Freq: Once | INTRAVENOUS | Status: AC
  Administered 2021-04-26: 1 g via INTRAVENOUS
  Filled 2021-04-26 (×2): qty 100

## 2021-04-26 MED ORDER — SODIUM CHLORIDE 0.9 % IV SOLN
2.0000 g | INTRAVENOUS | Status: DC
  Administered 2021-04-26 – 2021-04-29 (×4): 2 g via INTRAVENOUS
  Filled 2021-04-26: qty 20
  Filled 2021-04-26 (×2): qty 2
  Filled 2021-04-26 (×3): qty 20

## 2021-04-26 MED ORDER — APIXABAN 5 MG PO TABS
5.0000 mg | ORAL_TABLET | Freq: Two times a day (BID) | ORAL | Status: DC
  Administered 2021-04-26 (×2): 5 mg via ORAL
  Filled 2021-04-26 (×2): qty 1

## 2021-04-26 MED ORDER — LACTATED RINGERS IV SOLN: INTRAVENOUS | Status: DC

## 2021-04-26 MED ORDER — MAGNESIUM SULFATE 50 % IJ SOLN: 1.0000 g | Freq: Once | INTRAMUSCULAR | Status: DC

## 2021-04-26 MED ORDER — CARVEDILOL 12.5 MG PO TABS
12.5000 mg | ORAL_TABLET | Freq: Two times a day (BID) | ORAL | Status: DC
  Administered 2021-04-26 – 2021-04-27 (×2): 12.5 mg via ORAL
  Filled 2021-04-26 (×2): qty 1

## 2021-04-26 MED ORDER — POTASSIUM CHLORIDE CRYS ER 20 MEQ PO TBCR
40.0000 meq | EXTENDED_RELEASE_TABLET | Freq: Four times a day (QID) | ORAL | Status: AC
Start: 2021-04-26 — End: 2021-04-26
  Administered 2021-04-26 (×2): 40 meq via ORAL
  Filled 2021-04-26 (×2): qty 2

## 2021-04-26 NOTE — Progress Notes (Signed)
CC: Enteritis s Subjective: Feeling better.  Minimal abdominal discomfort.  Taking clears.  No nausea no vomiting.  No significant improvement.  KUB personally reviewed showing no evidence of free air no evidence of pneumatosis.  Objective: Vital signs in last 24 hours: Temp:  [97.8 F (36.6 C)-98.7 F (37.1 C)] 98.7 F (37.1 C) (10/01 1513) Pulse Rate:  [62-127] 101 (10/01 1513) Resp:  [14-25] 18 (10/01 1513) BP: (121-150)/(83-117) 134/95 (10/01 1513) SpO2:  [93 %-100 %] 97 % (10/01 1513) Last BM Date: 04/25/21  Intake/Output from previous day: 09/30 0701 - 10/01 0700 In: 1255.8 [I.V.:448.7; IV Piggyback:807.1] Out: -  Intake/Output this shift: No intake/output data recorded.  Physical exam: NAD alert Abd: soft, minimal diffuse tenderness w/o peritonitis Ext: well perfusewd and warm some edema   Lab Results: CBC  Recent Labs    04/25/21 1310 04/26/21 0451  WBC 16.2* 15.4*  HGB 16.9* 14.9  HCT 47.4* 44.8  PLT 402* 300   BMET Recent Labs    04/25/21 1310 04/26/21 0451  NA 132* 135  K 2.8* 3.1*  CL 88* 97*  CO2 29 28  GLUCOSE 317* 144*  BUN 27* 18  CREATININE 1.03* 0.83  CALCIUM 10.2 8.9   PT/INR Recent Labs    04/25/21 1830  LABPROT 15.0  INR 1.2   ABG No results for input(s): PHART, HCO3 in the last 72 hours.  Invalid input(s): PCO2, PO2  Studies/Results: CT Angio Chest PE W and/or Wo Contrast  Result Date: 04/25/2021 CLINICAL DATA:  PE suspected EXAM: CT ANGIOGRAPHY CHEST WITH CONTRAST TECHNIQUE: Multidetector CT imaging of the chest was performed using the standard protocol during bolus administration of intravenous contrast. Multiplanar CT image reconstructions and MIPs were obtained to evaluate the vascular anatomy. CONTRAST:  72mL OMNIPAQUE IOHEXOL 350 MG/ML SOLN COMPARISON:  None. FINDINGS: Cardiovascular: Satisfactory opacification of the pulmonary arteries to the segmental level. No evidence of pulmonary embolism. Cardiomegaly. Left  coronary artery calcifications. No pericardial effusion. Mediastinum/Nodes: No enlarged mediastinal, hilar, or axillary lymph nodes. Thyroid gland, trachea, and esophagus demonstrate no significant findings. Lungs/Pleura: Nonspecific infectious or inflammatory ground-glass airspace disease of the right lung base (series 7, image 57). No pleural effusion or pneumothorax. Upper Abdomen: Please see separately reported examination of the abdomen and pelvis. Musculoskeletal: No chest wall abnormality. No acute or significant osseous findings. Review of the MIP images confirms the above findings. IMPRESSION: 1. Negative examination for pulmonary embolism. 2. Nonspecific infectious or inflammatory ground-glass airspace disease of the right lung base. 3. Cardiomegaly and coronary artery disease. Electronically Signed   By: Jearld Lesch M.D.   On: 04/25/2021 17:04   CT ABDOMEN PELVIS W CONTRAST  Result Date: 04/25/2021 CLINICAL DATA:  Abdominal distension, upper abdominal pain, nausea, vomiting EXAM: CT ABDOMEN AND PELVIS WITH CONTRAST TECHNIQUE: Multidetector CT imaging of the abdomen and pelvis was performed using the standard protocol following bolus administration of intravenous contrast. CONTRAST:  15mL OMNIPAQUE IOHEXOL 350 MG/ML SOLN COMPARISON:  None. FINDINGS: Lower chest: No acute abnormality. Hepatobiliary: No solid liver abnormality is seen. No gallstones, gallbladder wall thickening, or biliary dilatation. Pancreas: Unremarkable. No pancreatic ductal dilatation or surrounding inflammatory changes. Spleen: Normal in size without significant abnormality. Adrenals/Urinary Tract: Adrenal glands are unremarkable. There are multiple hypoenhancing defects of the inferior pole of the right kidney (series 6, image 53). The left kidney is normal, without renal calculi, solid lesion, or hydronephrosis. Bladder is unremarkable. Stomach/Bowel: Stomach is within normal limits. Appendix appears normal. There are multiple  thickened  loops of mid to distal small bowel in the low abdomen and pelvis (series 3, image 77). Notably, the terminal ileum is spared. Vascular/Lymphatic: Aortic atherosclerosis. No enlarged abdominal or pelvic lymph nodes. Reproductive: No mass or other significant abnormality. Other: No abdominal wall hernia or abnormality. No abdominopelvic ascites. Musculoskeletal: No acute or significant osseous findings. IMPRESSION: 1. There are multiple thickened loops of mid to distal small bowel in the low abdomen and pelvis. Notably, the terminal ileum is spared. Findings are consistent with nonspecific infectious, inflammatory, or ischemic enteritis. 2. There are multiple hypoenhancing defects of the inferior pole of the right kidney, this appearance suggesting either pyelonephritis or perhaps infarction. Correlate with urinalysis. 3. Thromboembolic shower is a general differential consideration that could produce right kidney findings and small-bowel findings, and central source could be further investigated by echocardiography if desired. Aortic Atherosclerosis (ICD10-I70.0). Electronically Signed   By: Jearld Lesch M.D.   On: 04/25/2021 17:10   DG ABD ACUTE 2+V W 1V CHEST  Result Date: 04/26/2021 CLINICAL DATA:  Enteritis. Sepsis. Abdominal pain with nausea and vomiting. EXAM: DG ABDOMEN ACUTE WITH 1 VIEW CHEST COMPARISON:  None. FINDINGS: Cardiac size is not well evaluated on a portable film. Cardiomegaly not excluded. The hila and mediastinum are normal. No pneumothorax. The lungs are clear. No free air, portal venous gas, or pneumatosis. Air seen in nondilated colon. No evidence of obstruction. There is a paucity of small bowel gas. IMPRESSION: No acute abnormalities identified. No evidence of obstruction. No cause for the patient's symptoms noted pan Electronically Signed   By: Gerome Sam III M.D.   On: 04/26/2021 07:29   ECHOCARDIOGRAM COMPLETE  Result Date: 04/26/2021    ECHOCARDIOGRAM REPORT    Patient Name:   Brooke Miles Date of Exam: 04/26/2021 Medical Rec #:  [id number]  Height:       68.0 in Accession #:    [id number] Weight:       220.0 lb Date of Birth:  12/18/1955  BSA:          2.128 m Patient Age:    65 years   BP:           121/87 mmHg Patient Gender: F          HR:           114 bpm. Exam Location:  [facility] Procedure: 2D Echo Indications:     Atrial Fibrillation I48.91  History:         Patient has no prior history of Echocardiogram examinations.  Sonographer:     Overton Mam RDCS Referring Phys:  [id number] Floreen Comber PATEL Diagnosing Phys: Yvonne Kendall MD IMPRESSIONS  1. Left ventricular ejection fraction, by estimation, is 30 to 35%. The left ventricle has moderately decreased function. The left ventricle demonstrates global hypokinesis. The left ventricular internal cavity size was borderline dilated. There is mild  left ventricular hypertrophy. Left ventricular diastolic parameters are indeterminate.  2. Right ventricular systolic function is low normal. The right ventricular size is normal. There is normal pulmonary artery systolic pressure.  3. Left atrial size was severely dilated.  4. The mitral valve is degenerative. Moderate to severe mitral valve regurgitation. No evidence of mitral stenosis.  5. The aortic valve is tricuspid. There is mild thickening of the aortic valve. Aortic valve regurgitation is not visualized. Mild aortic valve sclerosis is present, with no evidence of aortic valve stenosis.  6. The inferior vena cava is normal in size with greater than 50% respiratory  variability, suggesting right atrial pressure of 3 mmHg. FINDINGS  Left Ventricle: Left ventricular ejection fraction, by estimation, is 30 to 35%. The left ventricle has moderately decreased function. The left ventricle demonstrates global hypokinesis. The left ventricular internal cavity size was borderline dilated. There is mild left ventricular hypertrophy. Left ventricular diastolic parameters are  indeterminate. Right Ventricle: The right ventricular size is normal. No increase in right ventricular wall thickness. Right ventricular systolic function is low normal. There is normal pulmonary artery systolic pressure. The tricuspid regurgitant velocity is 2.47 m/s,  and with an assumed right atrial pressure of 3 mmHg, the estimated right ventricular systolic pressure is 27.4 mmHg. Left Atrium: Left atrial size was severely dilated. Right Atrium: Right atrial size was normal in size. Pericardium: There is no evidence of pericardial effusion. Mitral Valve: The mitral valve is degenerative in appearance. There is mild thickening of the mitral valve leaflet(s). Moderate to severe mitral valve regurgitation. No evidence of mitral valve stenosis. Tricuspid Valve: The tricuspid valve is normal in structure. Tricuspid valve regurgitation is mild. Aortic Valve: The aortic valve is tricuspid. There is mild thickening of the aortic valve. Aortic valve regurgitation is not visualized. Mild aortic valve sclerosis is present, with no evidence of aortic valve stenosis. Aortic valve peak gradient measures 8.2 mmHg. Pulmonic Valve: The pulmonic valve was grossly normal. Pulmonic valve regurgitation is not visualized. No evidence of pulmonic stenosis. Aorta: The aortic root and ascending aorta are structurally normal, with no evidence of dilitation. Pulmonary Artery: The pulmonary artery is of normal size. Venous: The inferior vena cava was not well visualized. The inferior vena cava is normal in size with greater than 50% respiratory variability, suggesting right atrial pressure of 3 mmHg. IAS/Shunts: The interatrial septum was not well visualized.  LEFT VENTRICLE PLAX 2D LVIDd:         5.10 cm      Diastology LVIDs:         4.10 cm      LV e' medial:    5.00 cm/s LV PW:         1.30 cm      LV E/e' medial:  20.2 LV IVS:        1.20 cm      LV e' lateral:   10.80 cm/s LVOT diam:     2.10 cm      LV E/e' lateral: 9.4 LV SV:          45 LV SV Index:   21 LVOT Area:     3.46 cm  LV Volumes (MOD) LV vol d, MOD A2C: 98.7 ml LV vol d, MOD A4C: 101.0 ml LV vol s, MOD A2C: 60.6 ml LV vol s, MOD A4C: 58.6 ml LV SV MOD A2C:     38.1 ml LV SV MOD A4C:     101.0 ml LV SV MOD BP:      40.3 ml RIGHT VENTRICLE RV Basal diam:  3.00 cm RV S prime:     13.80 cm/s TAPSE (M-mode): 1.9 cm LEFT ATRIUM              Index       RIGHT ATRIUM           Index LA diam:        4.70 cm  2.21 cm/m  RA Area:     15.50 cm LA Vol (A2C):   110.0 ml 51.69 ml/m RA Volume:   40.50 ml  19.03 ml/m LA Vol (  A4C):   114.0 ml 53.57 ml/m LA Biplane Vol: 113.0 ml 53.10 ml/m  AORTIC VALVE                PULMONIC VALVE AV Area (Vmax): 2.02 cm    PV Vmax:       0.96 m/s AV Vmax:        143.00 cm/s PV Peak grad:  3.7 mmHg AV Peak Grad:   8.2 mmHg LVOT Vmax:      83.60 cm/s LVOT Vmean:     60.000 cm/s LVOT VTI:       0.131 m  AORTA Ao Root diam: 2.60 cm Ao Asc diam:  2.90 cm MITRAL VALVE                TRICUSPID VALVE MV Area (PHT): 2.53 cm     TV Peak grad:   21.3 mmHg MV Decel Time: 300 msec     TV Vmax:        2.31 m/s MV E velocity: 101.00 cm/s  TR Peak grad:   24.4 mmHg                             TR Vmax:        247.00 cm/s                              SHUNTS                             Systemic VTI:  0.13 m                             Systemic Diam: 2.10 cm Yvonne Kendall MD Electronically signed by Yvonne Kendall MD Signature Date/Time: 04/26/2021/3:21:26 PM    Final     Anti-infectives: Anti-infectives (From admission, onward)    Start     Dose/Rate Route Frequency Ordered Stop   04/26/21 1200  cefTRIAXone (ROCEPHIN) 2 g in sodium chloride 0.9 % 100 mL IVPB        2 g 200 mL/hr over 30 Minutes Intravenous Every 24 hours 04/26/21 0849     04/25/21 2200  piperacillin-tazobactam (ZOSYN) IVPB 3.375 g  Status:  Discontinued        3.375 g 12.5 mL/hr over 240 Minutes Intravenous Every 8 hours 04/25/21 2017 04/26/21 0849   04/25/21 1830  piperacillin-tazobactam  (ZOSYN) IVPB 3.375 g        3.375 g 100 mL/hr over 30 Minutes Intravenous  Once 04/25/21 1822 04/25/21 1858   04/25/21 1715  cefTRIAXone (ROCEPHIN) 2 g in sodium chloride 0.9 % 100 mL IVPB  Status:  Discontinued        2 g 200 mL/hr over 30 Minutes Intravenous Every 24 hours 04/25/21 1714 04/25/21 1822   04/25/21 1715  azithromycin (ZITHROMAX) 500 mg in sodium chloride 0.9 % 250 mL IVPB  Status:  Discontinued        500 mg 250 mL/hr over 60 Minutes Intravenous Every 24 hours 04/25/21 1714 04/25/21 1822       Assessment/Plan:  Enteritis ischemic versus infectious.  Currently her abdomen is benign.  No need for surgical intervention.  I agree with continuation of current therapy in the form of antibiotics and heparin. Will continue to follow  Sterling Big, MD, [facility]  04/26/2021

## 2021-04-26 NOTE — Progress Notes (Signed)
*  PRELIMINARY RESULTS* Echocardiogram 2D Echocardiogram has been performed.  Brooke Miles 04/26/2021, 12:21 PM

## 2021-04-26 NOTE — Assessment & Plan Note (Addendum)
Troponin minimally elevated at 59, likely demand ischemia in setting of A. fib with RVR and sepsis.

## 2021-04-26 NOTE — Progress Notes (Signed)
ANTICOAGULATION CONSULT NOTE - Initial Consult  Pharmacy Consult for Heparin Drip Indication: atrial fibrillation and possible heart thrombus  Allergies  Allergen Reactions   Quinolones Hives and Itching    Patient Measurements: Height: 5\' 8"  (172.7 cm) Weight: 99.8 kg (220 lb) IBW/kg (Calculated) : 63.9 Heparin Dosing Weight: 85.8 kg  Vital Signs: Temp: 97.8 F (36.6 C) (09/30 2053) Temp Source: Oral (09/30 2053) BP: 147/83 (09/30 2053) Pulse Rate: 82 (09/30 2053)  Labs: Recent Labs    04/25/21 1310 04/25/21 1830 04/25/21 2154 04/26/21 0203  HGB 16.9*  --   --   --   HCT 47.4*  --   --   --   PLT 402*  --   --   --   APTT  --  27  --   --   LABPROT  --  15.0  --   --   INR  --  1.2  --   --   HEPARINUNFRC  --   --   --  0.50  CREATININE 1.03*  --   --   --   TROPONINIHS 59*  --  63*  --      Estimated Creatinine Clearance: 68.2 mL/min (A) (by C-G formula based on SCr of 1.03 mg/dL (H)).   Medical History: Past Medical History:  Diagnosis Date   Diabetes mellitus without complication (HCC)    Hyperlipidemia    Hypertension     Assessment: Patient is a 65yo female admitted with abdominal pain. Patient found to be in afib and CT is concerning for thromboembolic shower. Pharmacy consulted for Heparin dosing.  Goal of Therapy:  Heparin level 0.3-0.7 units/ml Monitor platelets by anticoagulation protocol: Yes   Plan:  10/01: HL @ 0203 = 0.5, therapeutic X 1  Will continue pt on current rate and recheck HL on 10/1 @ 0800.   04/26/2021 3:11 AM

## 2021-04-26 NOTE — Assessment & Plan Note (Addendum)
Hyperglycemic on admission without evidence of DKA/HHS. Glucose labile - Hold metformin - COtinue SSI

## 2021-04-26 NOTE — Assessment & Plan Note (Addendum)
Patient presenting with leukocytosis, tachypnea, tachycardia, lactic acidosis with findings of enteritis on CT.  Abdomen x-ray this morning normal, blood cultures negative  -Continue IV antibiotics -Abdomen nonsurgical, resume Eliquis, stop heparin -Follow blood cultures

## 2021-04-26 NOTE — Assessment & Plan Note (Addendum)
Due to sepsis, now resolved

## 2021-04-26 NOTE — Assessment & Plan Note (Addendum)
home chlorthalidone, lisinopril on hold - Stop amlodipine, chlorthalidone - Start Coreg - Hold lisinopril for now

## 2021-04-26 NOTE — Assessment & Plan Note (Addendum)
Still low - Continue K suppl

## 2021-04-26 NOTE — Assessment & Plan Note (Addendum)
Kidney findings in addition to small bowel findings on CT imaging poses question of thromboembolic showering.

## 2021-04-26 NOTE — Progress Notes (Signed)
Progress Note    Brooke Miles   UGQ:[id number]  DOB: [birth year]  DOA: 04/25/2021     1 Date of Service: 04/26/2021    Brief summary: Ms Skoda is a 65 y.o. F with DM, HTN who presented with 1 day N/V.  In the ER, noted to be in Afib.  Mag and K low.  WBC 16K.  Troponin low and flat.  Lactate elevated.  CTA chest no PE.  Coronary artery calcifications noted.  Some lung base disease noted.  CT abdomen showed multiple thickened loops of bowel.  Multiple hypoenhancing defects of the inferior pole of the right kidney are seen suggesting either pyelonephritis or infarction.      9/30: Admitted and started on antibiotics.     Subjective:  Feels well.  No chest pain, no fever, no confusion.  Hospital Problems * Severe sepsis [facility]) Patient presenting with leukocytosis, tachypnea, tachycardia, lactic acidosis with findings of enteritis on CT.  Abdomen x-ray this morning normal, blood cultures negative  -Continue IV antibiotics -Abdomen nonsurgical, resume Eliquis, stop heparin -Follow blood cultures  Chronic systolic CHF (congestive heart failure) (HCC) EF found this hospitalization to be 30 to 35% with moderate to severe mitral regurgitation. - Consult cardiology given new reduced EF - Start carvedilol  Acute metabolic encephalopathy Due to sepsis, now resolved  Atrial fibrillation with rapid ventricular response (HCC) Evidently this is new.  CHA2DS2-VASc 4 for gender, hypertension, CHF, and diabetes.  TSH normal. - Eliquis   - Start Coreg  Hypomagnesemia Supplemented, still 1.7 - Repeat Mag suppl  Hypokalemia Still low - Continue K suppl  Hypertension associated with diabetes (HCC) home chlorthalidone, lisinopril on hold - Stop amlodipine, chlorthalidone - Start Coreg - Hold lisinopril for now  Demand ischemia (HCC) Troponin minimally elevated at 59, likely demand ischemia in setting of A. fib with RVR and sepsis.    Type 2 diabetes mellitus (HCC) Hyperglycemic  on admission without evidence of DKA/HHS. Glucose labile - Hold metformin - COtinue SSI   Abnormal CT scan, kidney Kidney findings in addition to small bowel findings on CT imaging poses question of thromboembolic showering.        Objective Vital signs were reviewed and unremarkable except for: Blood pressure: elevated  and Pulse: tachycardic  BP (!) 134/95 (BP Location: Left Arm)   Pulse (!) 101   Temp 98.7 F (37.1 C) (Oral)   Resp 18   Ht 5\' 8"  (1.727 m)   Wt 99.8 kg   SpO2 97%   BMI 33.45 kg/m       Exam General appearance: Adult female, sitting up in bed, no acute distress, interactive     HEENT: Anicteric, conjunctive a pink, lids and lashes normal.  No nasal deformity, discharge, or epistaxis. Skin: Without suspicious rashes or lesions, warm and dry Cardiac: Tachycardic, irregular, no murmurs, mild lower extremity edema, no JVD Respiratory: Normal respiratory rate and rhythm, lungs clear without rales or wheezes Abdomen: Abdomen soft with out tenderness palpation or guarding, no ascites or distention, no CVA tenderness MSK: Normal muscle bulk and tone, no deformities or effusions of the large joints of the upper or lower extremities bilaterally Neuro: Awake and alert, extraocular movements intact, face symmetric, speech fluent, upper extremity strength slightly weak but generally symmetric Psych: Attention normal, affect blunted, judgment insight appear normal     Labs / Other Information My review of labs, imaging, notes and other tests is significant for Elevated troponin, low magnesium, low potassium, leukocytosis, elevated lactic acid,  normal x-ray, low sodium, reduced EF on echocardiogram     Time spent: 35 minutes [facility] 04/26/2021, 3:48 PM

## 2021-04-26 NOTE — Assessment & Plan Note (Addendum)
EF found this hospitalization to be 30 to 35% with moderate to severe mitral regurgitation. - Consult cardiology given new reduced EF - Start carvedilol

## 2021-04-26 NOTE — Assessment & Plan Note (Addendum)
Evidently this is new.  CHA2DS2-VASc 4 for gender, hypertension, CHF, and diabetes.  TSH normal. - Eliquis   - Start Coreg

## 2021-04-26 NOTE — Assessment & Plan Note (Addendum)
Supplemented, still 1.7 - Repeat Mag suppl

## 2021-04-26 NOTE — Hospital Course (Addendum)
Brooke Miles is a 65 y.o. F with DM, HTN who presented with acute onset abdominal pain and N/V.  Evidently, family have noticed the patient is dyspneic with exertion for about a year or two.  She also does acknowledge palpitations or tachycardia with exertion during this interval.  There is also a family history (in a brother) of Afib.  Despite this chronic DOE, the patient was in her normal health until the day before admission, when she finished breakfast, and then developed severe abdominal pain suddenly, associated with nausea.  This was intense, so she came to the ER.  In the ER, noted to be in Afib with RVR.  Mag and K low.  WBC 16K.  Troponin low and flat.  Lactate elevated.  CTA chest no PE.  Coronary artery calcifications noted.  Some lung base disease noted.  CT abdomen showed multiple thickened loops of bowel.  Multiple hypoenhancing defects of the inferior pole of the right kidney are seen suggesting either pyelonephritis or infarction.      9/30: Admitted and started on antibiotics, heparin gtt, Gen Surg consulted 10/1: Serial abdominal exams much better, x-ray abdomen normal --> echo showed EF 30-35%, mod-severe MR 10/2: Abdominal pain still present slightly, appetite poor, but lactic acid cleared; cardiology consulted 10/3: Advanced to solid food but developed abdominal pain and vomiting --> back to clears 10/4: Pain completely resolved again.  Tolerated clear liquid diet, then soft diet, and had no return of pain or nausea, felt at baseline    UTI ruled out Sepsis ruled out

## 2021-04-27 ENCOUNTER — Inpatient Hospital Stay: Payer: Self-pay

## 2021-04-27 DIAGNOSIS — I4891 Unspecified atrial fibrillation: Secondary | ICD-10-CM

## 2021-04-27 DIAGNOSIS — R7989 Other specified abnormal findings of blood chemistry: Secondary | ICD-10-CM | POA: Diagnosis present

## 2021-04-27 DIAGNOSIS — K559 Vascular disorder of intestine, unspecified: Secondary | ICD-10-CM | POA: Diagnosis present

## 2021-04-27 DIAGNOSIS — I248 Other forms of acute ischemic heart disease: Secondary | ICD-10-CM

## 2021-04-27 DIAGNOSIS — N390 Urinary tract infection, site not specified: Secondary | ICD-10-CM

## 2021-04-27 LAB — APTT
aPTT: 29 seconds (ref 24–36)
aPTT: 86 seconds — ABNORMAL HIGH (ref 24–36)
aPTT: 83 seconds — ABNORMAL HIGH (ref 24–36)

## 2021-04-27 LAB — CBC
HCT: 41.7 % (ref 36.0–46.0)
Hemoglobin: 14.8 g/dL (ref 12.0–15.0)
MCH: 29.7 pg (ref 26.0–34.0)
MCHC: 35.5 g/dL (ref 30.0–36.0)
MCV: 83.6 fL (ref 80.0–100.0)
Platelets: 310 10*3/uL (ref 150–400)
RBC: 4.99 MIL/uL (ref 3.87–5.11)
RDW: 13.7 % (ref 11.5–15.5)
WBC: 15.8 10*3/uL — ABNORMAL HIGH (ref 4.0–10.5)
nRBC: 0 % (ref 0.0–0.2)

## 2021-04-27 LAB — GLUCOSE, CAPILLARY
Glucose-Capillary: 120 mg/dL — ABNORMAL HIGH (ref 70–99)
Glucose-Capillary: 124 mg/dL — ABNORMAL HIGH (ref 70–99)
Glucose-Capillary: 131 mg/dL — ABNORMAL HIGH (ref 70–99)
Glucose-Capillary: 142 mg/dL — ABNORMAL HIGH (ref 70–99)
Glucose-Capillary: 256 mg/dL — ABNORMAL HIGH (ref 70–99)
Glucose-Capillary: 342 mg/dL — ABNORMAL HIGH (ref 70–99)

## 2021-04-27 LAB — BASIC METABOLIC PANEL
Anion gap: 13 (ref 5–15)
BUN: 13 mg/dL (ref 8–23)
CO2: 25 mmol/L (ref 22–32)
Calcium: 9.2 mg/dL (ref 8.9–10.3)
Chloride: 96 mmol/L — ABNORMAL LOW (ref 98–111)
Creatinine, Ser: 0.77 mg/dL (ref 0.44–1.00)
GFR, Estimated: >60 mL/min (ref >60)
Glucose, Bld: 130 mg/dL — ABNORMAL HIGH (ref 70–99)
Potassium: 3.4 mmol/L — ABNORMAL LOW (ref 3.5–5.1)
Sodium: 134 mmol/L — ABNORMAL LOW (ref 135–145)

## 2021-04-27 LAB — LACTIC ACID, PLASMA: Lactic Acid, Venous: 1.6 mmol/L (ref 0.5–1.9)

## 2021-04-27 LAB — PROTIME-INR
INR: 1.3 — ABNORMAL HIGH (ref 0.8–1.2)
Prothrombin Time: 15.8 seconds — ABNORMAL HIGH (ref 11.4–15.2)

## 2021-04-27 LAB — URINE CULTURE

## 2021-04-27 LAB — MAGNESIUM: Magnesium: 1.8 mg/dL (ref 1.7–2.4)

## 2021-04-27 LAB — HEPARIN LEVEL (UNFRACTIONATED): Heparin Unfractionated: >1.1 IU/mL — ABNORMAL HIGH (ref 0.30–0.70)

## 2021-04-27 MED ORDER — POTASSIUM CHLORIDE CRYS ER 20 MEQ PO TBCR
40.0000 meq | EXTENDED_RELEASE_TABLET | Freq: Once | ORAL | Status: AC
  Administered 2021-04-27: 40 meq via ORAL
  Filled 2021-04-27: qty 2

## 2021-04-27 MED ORDER — DIGOXIN 250 MCG PO TABS
0.2500 mg | ORAL_TABLET | Freq: Three times a day (TID) | ORAL | Status: AC
Start: 2021-04-27 — End: 2021-04-28
  Administered 2021-04-27 – 2021-04-28 (×3): 0.25 mg via ORAL
  Filled 2021-04-27 (×3): qty 1

## 2021-04-27 MED ORDER — MAGNESIUM SULFATE IN D5W 1-5 GM/100ML-% IV SOLN
1.0000 g | Freq: Once | INTRAVENOUS | Status: AC
  Administered 2021-04-27: 1 g via INTRAVENOUS
  Filled 2021-04-27: qty 100

## 2021-04-27 MED ORDER — CARVEDILOL 25 MG PO TABS
25.0000 mg | ORAL_TABLET | Freq: Two times a day (BID) | ORAL | Status: DC
  Administered 2021-04-27 – 2021-04-29 (×5): 25 mg via ORAL
  Filled 2021-04-27 (×5): qty 1

## 2021-04-27 MED ORDER — HEPARIN (PORCINE) 25000 UT/250ML-% IV SOLN
1250.0000 [IU]/h | INTRAVENOUS | Status: DC
  Administered 2021-04-27 – 2021-04-28 (×2): 1250 [IU]/h via INTRAVENOUS
  Filled 2021-04-27 (×2): qty 250

## 2021-04-27 MED ORDER — MELATONIN 5 MG PO TABS
2.5000 mg | ORAL_TABLET | Freq: Every evening | ORAL | Status: DC | PRN
  Administered 2021-04-27: 2.5 mg via ORAL
  Filled 2021-04-27: qty 1

## 2021-04-27 MED ORDER — DIGOXIN 125 MCG PO TABS
0.1250 mg | ORAL_TABLET | Freq: Every day | ORAL | Status: DC
  Administered 2021-04-29: 0.125 mg via ORAL
  Filled 2021-04-27: qty 1

## 2021-04-27 MED ORDER — HEPARIN BOLUS VIA INFUSION
4000.0000 [IU] | Freq: Once | INTRAVENOUS | Status: AC
  Administered 2021-04-27: 4000 [IU] via INTRAVENOUS
  Filled 2021-04-27: qty 4000

## 2021-04-27 MED ORDER — NON FORMULARY: 3.0000 mg | Freq: Every evening | Status: DC | PRN

## 2021-04-27 NOTE — Progress Notes (Signed)
ANTICOAGULATION CONSULT NOTE - Initial Consult  Pharmacy Consult for Heparin Drip Indication: New onset Atrial Fibrillation   Allergies  Allergen Reactions   Quinolones Hives and Itching    Patient Measurements: Height: 5\' 8"  (172.7 cm) Weight: 99.8 kg (220 lb) IBW/kg (Calculated) : 63.9 Heparin Dosing Weight: 85.8 kg  Vital Signs: Temp: 99 F (37.2 C) (10/02 1519) Temp Source: Oral (10/02 1519) BP: 113/97 (10/02 1519) Pulse Rate: 87 (10/02 1519)  Labs: Recent Labs    04/25/21 1310 04/25/21 1310 04/25/21 1830 04/25/21 2154 04/26/21 0203 04/26/21 0451 04/26/21 0754 04/27/21 0826 04/27/21 1005 04/27/21 1701 04/27/21 2158  HGB 16.9*  --   --   --   --  14.9  --   --  14.8  --   --   HCT 47.4*  --   --   --   --  44.8  --   --  41.7  --   --   PLT 402*  --   --   --   --  300  --   --  310  --   --   APTT  --    < > 27  --   --   --   --   --  29 86* 83*  LABPROT  --   --  15.0  --   --   --   --   --  15.8*  --   --   INR  --   --  1.2  --   --   --   --   --  1.3*  --   --   HEPARINUNFRC  --   --   --   --  0.50  --  0.41  --  >1.10*  --   --   CREATININE 1.03*  --   --   --   --  0.83  --  0.77  --   --   --   TROPONINIHS 59*  --   --  63*  --   --   --   --   --   --   --    < > = values in this interval not displayed.     Estimated Creatinine Clearance: 87.8 mL/min (by C-G formula based on SCr of 0.77 mg/dL).   Medical History: Past Medical History:  Diagnosis Date   Diabetes mellitus without complication (HCC)    Hyperlipidemia    Hypertension     Assessment: Patient is a 65yo female admitted with sepsis secondary to enteritis. Patient found to have new onset Atrial fibrillation with RVR. Pharmacy originally consulted for heparin infusion on 9/30, then patient was switched to Apixaban. Pharmacy now  consulted for for heparin restart. Last Apixaban dose 10/1 @ 2018.   Date Time aPTT/HL Rate/Comment 10/2 1701 86s / --  Therapeutic x1; CTM aPTT/HL for  correlation      Baseline Labs: aPTT - 29s INR - 1.2 Hgb - 14.9>14.8 Plts - 300>310  Goal of Therapy:  aPTT: 0.66-1.02 Heparin level 0.3-0.7 units/ml Monitor platelets by anticoagulation protocol: Yes   Plan:  10/2:  HL @ 2158 = 83, therapeutic X 2  Will continue pt on current rate and recheck aPTT and HL on 10/3 with AM labs.  2159, PharmD, [facility] Clinical Pharmacist 04/27/2021 11:51 PM

## 2021-04-27 NOTE — Progress Notes (Signed)
ANTICOAGULATION CONSULT NOTE - Initial Consult  Pharmacy Consult for Heparin Drip Indication: New onset Atrial Fibrillation   Allergies  Allergen Reactions   Quinolones Hives and Itching    Patient Measurements: Height: 5\' 8"  (172.7 cm) Weight: 99.8 kg (220 lb) IBW/kg (Calculated) : 63.9 Heparin Dosing Weight: 85.8 kg  Vital Signs: Temp: 99 F (37.2 C) (10/02 1519) Temp Source: Oral (10/02 1519) BP: 113/97 (10/02 1519) Pulse Rate: 87 (10/02 1519)  Labs: Recent Labs    04/25/21 1310 04/25/21 1830 04/25/21 2154 04/26/21 0203 04/26/21 0451 04/26/21 0754 04/27/21 0826 04/27/21 1005 04/27/21 1701  HGB 16.9*  --   --   --  14.9  --   --  14.8  --   HCT 47.4*  --   --   --  44.8  --   --  41.7  --   PLT 402*  --   --   --  300  --   --  310  --   APTT  --  27  --   --   --   --   --  29 86*  LABPROT  --  15.0  --   --   --   --   --  15.8*  --   INR  --  1.2  --   --   --   --   --  1.3*  --   HEPARINUNFRC  --   --   --  0.50  --  0.41  --  >1.10*  --   CREATININE 1.03*  --   --   --  0.83  --  0.77  --   --   TROPONINIHS 59*  --  63*  --   --   --   --   --   --      Estimated Creatinine Clearance: 87.8 mL/min (by C-G formula based on SCr of 0.77 mg/dL).   Medical History: Past Medical History:  Diagnosis Date   Diabetes mellitus without complication (HCC)    Hyperlipidemia    Hypertension     Assessment: Patient is a 65yo female admitted with sepsis secondary to enteritis. Patient found to have new onset Atrial fibrillation with RVR. Pharmacy originally consulted for heparin infusion on 9/30, then patient was switched to Apixaban. Pharmacy now  consulted for for heparin restart. Last Apixaban dose 10/1 @ 2018.   Date Time aPTT/HL Rate/Comment 10/2 1701 86s / --  Therapeutic x1; CTM aPTT/HL for correlation      Baseline Labs: aPTT - 29s INR - 1.2 Hgb - 14.9>14.8 Plts - 300>310  Goal of Therapy:  aPTT: 0.66-1.02 Heparin level 0.3-0.7 units/ml Monitor  platelets by anticoagulation protocol: Yes   Plan:  Therapeutic aPTT x1 @86s  (goal 66-102s) Continue heparin infusion at 1250 units/hr Check aPTT in 6 hours and daily while on heparin until labs correlate.  Titrate by aPTT alone until lab correlation, then titrate by anti-xa alone. Continue to monitor H&H and platelets daily while on hep gtt  [id number], PharmD, [facility] Clinical Pharmacist 04/27/2021 6:04 PM

## 2021-04-27 NOTE — Assessment & Plan Note (Signed)
BP normal - Stop home amlodipine, chlorthalidone - Continue Coreg - Hold lisinopril for now

## 2021-04-27 NOTE — Assessment & Plan Note (Addendum)
I feel her leukocytosis and lactic acidosis are better explained by bowel ischemia and that tachycardia is from Afib, sepsis ruled out.    Blood cultures negative, urine multiple species. - Continue Rocephin - Repeat urine culture

## 2021-04-27 NOTE — Assessment & Plan Note (Signed)
Resolved this morning, just with heparin, new Coreg.  I favor that this is not infection/sepsis, but that it is from bowel ischemia, which is resolving.

## 2021-04-27 NOTE — Assessment & Plan Note (Signed)
EF found this hospitalization to be 30 to 35% with moderate to severe mitral regurgitation.    - Consult cardiology given new reduced EF - Continue carvedilol

## 2021-04-27 NOTE — Progress Notes (Signed)
Progress Note    Brooke Miles   QMV:[id number]  DOB: 05-29-1956  DOA: 04/25/2021     2 Date of Service: 04/27/2021    Brief summary: Brooke Miles is a 65 y.o. F with DM, HTN who presented with 1 day N/V.  Patient speaks Wolof and also tends to minimize her symptoms, which both limit history taking.  Evidently, she lives in [place], was here for about a year in 2020 during the Pandemic, and then came back about a month ago, in part for medical care.  Per daughter-in-law, family have noticed she is dyspneic with exertion for about a year or two.  She also does acknowledge palpitations or tachycardia with exertion during this interval.  There is also a family history (in a brother) of Afib.  Despite this chronic DOE, the patient was in her normal health until the day before admission, when she finished breakfast, and then developed severe abdominal pain suddenly, associated with nausea.  THis was intense, so she came to the ER.  In the ER, noted to be in Afib with RVR.  Mag and K low.  WBC 16K.  Troponin low and flat.  Lactate elevated.  CTA chest no PE.  Coronary artery calcifications noted.  Some lung base disease noted.  CT abdomen showed multiple thickened loops of bowel.  Multiple hypoenhancing defects of the inferior pole of the right kidney are seen suggesting either pyelonephritis or infarction.      9/30: Admitted and started on antibiotics, heparin gtt, Gen Surg consulted 10/1: Serial abdominal exams much better, x-ray abdomen normal --> echo showed EF 30-35%, mod-severe MR 10/2: Abdominal pain still present slightly, appetite poor, but lactic acid cleared; cardiology consulted         Subjective:  Abdominal pain is still there, but low level.  No fever. Ate solid food last night and has low appetite but no post-prandial pain. Still with dysuria. No swelling, orthopnea, chest pain, palpitations.   All history through telephonic [facility] interpreter  Hospital Problems *  Ischemic enteritis [facility]) Presented with acute onset generalized abdominal pain.  Lactate elevated, and CT showing thickened bowel, consistent with enteritis, ischemic vs infectious.    Again, because of language barrier and patient's minimization, diagnosis is difficult, but I favor that the hyperacute onset, elevated lactate without classic sepsis appearance, and CT findings represent an embolic event and that her pain is from enteric ischemia, but seems to be improving.  (The alternative, that this is pyelonephritis and severe sepsis, seems less likely to me from examining her and doesn't explain the thickened bowel loops).  - Bowel rest - IV heparin - Consult General Surgery, appreciate cares  Chronic systolic CHF (congestive heart failure) (HCC) EF found this hospitalization to be 30 to 35% with moderate to severe mitral regurgitation.    - Consult cardiology given new reduced EF - Continue carvedilol  Atrial fibrillation with rapid ventricular response (HCC) Evidently this is new.  CHA2DS2-VASc 4 for gender, hypertension, CHF, and diabetes.  TSH normal.  She was initially started on heparin, transitioned yesterday to Eliquis, until her Echo showed reduced EF, mitral regurg, and her lactate (which was ordered at 7:51am but not drawn until 6:59pm) resulted with lactate 4.  I will resume heparin, until her abdominal pain is clearly improving and plans for anticoagulation from Cardiology can be finalized.  - Resume IV heparin - Consult Cardiology re: reduced EF, MR, and Afib - Continue Coreg, titrate up  Elevated lactic acid level Resolved  this morning, just with heparin, new Coreg.  I favor that this is not infection/sepsis, but that it is from bowel ischemia, which is resolving.  Acute lower UTI I feel her leukocytosis and lactic acidosis are better explained by bowel ischemia and that tachycardia is from Afib, sepsis ruled out.    Blood cultures negative, urine multiple  species. - Continue Rocephin - Repeat urine culture    Hypomagnesemia Keep >2 - Repeat Mag suppl  Hypokalemia Keep >4 - Repeat K supplement  Hypertension associated with diabetes (HCC) BP normal - Stop home amlodipine, chlorthalidone - Continue Coreg - Hold lisinopril for now  Demand ischemia (HCC) Troponin minimally elevated at 59, likely demand ischemia in setting of A. fib with RVR and sepsis.    Type 2 diabetes mellitus (HCC) Hyperglycemic on admission without evidence of DKA/HHS. Glucoses better controlled now - Hold metformin - Continue SSI  Hyponatremia Mild, asymptomatic  Abnormal CT scan, kidney Kidney findings in addition to small bowel findings on CT imaging poses question of thromboembolic showering.   I think this is more likely than pyelonephritis     Objective Vital signs were reviewed and unremarkable except for: Pulse: 100s  BP 115/73 (BP Location: Left Arm)   Pulse (!) 103   Temp 98.2 F (36.8 C) (Oral)   Resp 18   Ht 5\' 8"  (1.727 m)   Wt 99.8 kg   SpO2 100%   BMI 33.45 kg/m       Exam General appearance: Adult female, lying in bed, no acute distress.  Interactive.     HEENT: Anicteric, conjunctiva normal, lids and lashes normal, no nasal deformity, discharge or epistaxis.  Lips and dentition normal.  No oral lesions, OP moist.   Skin: No suspicious rashes or lesions. Cardiac: Tachycardic, irregularly irregular.  No JVD, maybe JVP up, I am unconvinced, trace trace pitting pretibial edema.  Radial pulses equal.  Respiratory: Nromal respiratory rate and rhythm, no whezing, faint basilar crackles. Abdomen: Mild left sided TTP, no guarding, no rebound, no distension, no masses.  No HSM.  MSK: Normal muscle bulk and tone. Neuro: Awake and alert, EOMI, moves all four extremities with normal strength and coordination, speech fluent, face symmetric.   Psych: Attention normal, affect appropriate, judgment and insight seem normal.      Labs /  Other Information My review of labs, imaging, notes and other tests is significant for lactate 4 last night, down to 1.6 this morning.  Renal function normal.  K and Mag still below target.  WBC still elevated.  Abd x-ray this morning normal.     Time spent: 35 minutes [facility] 04/27/2021, 11:57 AM

## 2021-04-27 NOTE — Progress Notes (Signed)
CC: Enteriris Subjective:  Minimal abdominal discomfort.  Taking clears.  No nausea no vomiting.  No significant improvement.  KUB personally reviewed showing no evidence of free air no evidence of pneumatosis. WBC still 15   Objective: Vital signs in last 24 hours: Temp:  [97.8 F (36.6 C)-99.3 F (37.4 C)] 98.2 F (36.8 C) (10/02 0734) Pulse Rate:  [94-103] 103 (10/02 0734) Resp:  [18-20] 18 (10/02 0734) BP: (115-134)/(73-104) 115/73 (10/02 0734) SpO2:  [97 %-100 %] 100 % (10/02 0734) Last BM Date: 04/26/21  Intake/Output from previous day: 10/01 0701 - 10/02 0700 In: 703.6 [P.O.:270; I.V.:317.7; IV Piggyback:115.8] Out: -  Intake/Output this shift: Total I/O In: 240 [P.O.:240] Out: -   Physical exam: NAD alert Abd: soft, minimal diffuse tenderness w/o peritonitis Ext: well perfusewd and warm some edema    Lab Results: CBC  Recent Labs    04/26/21 0451 04/27/21 1005  WBC 15.4* 15.8*  HGB 14.9 14.8  HCT 44.8 41.7  PLT 300 310   BMET Recent Labs    04/26/21 0451 04/27/21 0826  NA 135 134*  K 3.1* 3.4*  CL 97* 96*  CO2 28 25  GLUCOSE 144* 130*  BUN 18 13  CREATININE 0.83 0.77  CALCIUM 8.9 9.2   PT/INR Recent Labs    04/25/21 1830 04/27/21 1005  LABPROT 15.0 15.8*  INR 1.2 1.3*   ABG No results for input(s): PHART, HCO3 in the last 72 hours.  Invalid input(s): PCO2, PO2  Studies/Results: CT Angio Chest PE W and/or Wo Contrast  Result Date: 04/25/2021 CLINICAL DATA:  PE suspected EXAM: CT ANGIOGRAPHY CHEST WITH CONTRAST TECHNIQUE: Multidetector CT imaging of the chest was performed using the standard protocol during bolus administration of intravenous contrast. Multiplanar CT image reconstructions and MIPs were obtained to evaluate the vascular anatomy. CONTRAST:  58mL OMNIPAQUE IOHEXOL 350 MG/ML SOLN COMPARISON:  None. FINDINGS: Cardiovascular: Satisfactory opacification of the pulmonary arteries to the segmental level. No evidence of pulmonary  embolism. Cardiomegaly. Left coronary artery calcifications. No pericardial effusion. Mediastinum/Nodes: No enlarged mediastinal, hilar, or axillary lymph nodes. Thyroid gland, trachea, and esophagus demonstrate no significant findings. Lungs/Pleura: Nonspecific infectious or inflammatory ground-glass airspace disease of the right lung base (series 7, image 57). No pleural effusion or pneumothorax. Upper Abdomen: Please see separately reported examination of the abdomen and pelvis. Musculoskeletal: No chest wall abnormality. No acute or significant osseous findings. Review of the MIP images confirms the above findings. IMPRESSION: 1. Negative examination for pulmonary embolism. 2. Nonspecific infectious or inflammatory ground-glass airspace disease of the right lung base. 3. Cardiomegaly and coronary artery disease. Electronically Signed   By: Jearld Lesch M.D.   On: 04/25/2021 17:04   CT ABDOMEN PELVIS W CONTRAST  Result Date: 04/25/2021 CLINICAL DATA:  Abdominal distension, upper abdominal pain, nausea, vomiting EXAM: CT ABDOMEN AND PELVIS WITH CONTRAST TECHNIQUE: Multidetector CT imaging of the abdomen and pelvis was performed using the standard protocol following bolus administration of intravenous contrast. CONTRAST:  59mL OMNIPAQUE IOHEXOL 350 MG/ML SOLN COMPARISON:  None. FINDINGS: Lower chest: No acute abnormality. Hepatobiliary: No solid liver abnormality is seen. No gallstones, gallbladder wall thickening, or biliary dilatation. Pancreas: Unremarkable. No pancreatic ductal dilatation or surrounding inflammatory changes. Spleen: Normal in size without significant abnormality. Adrenals/Urinary Tract: Adrenal glands are unremarkable. There are multiple hypoenhancing defects of the inferior pole of the right kidney (series 6, image 53). The left kidney is normal, without renal calculi, solid lesion, or hydronephrosis. Bladder is unremarkable. Stomach/Bowel: Stomach is  within normal limits. Appendix  appears normal. There are multiple thickened loops of mid to distal small bowel in the low abdomen and pelvis (series 3, image 77). Notably, the terminal ileum is spared. Vascular/Lymphatic: Aortic atherosclerosis. No enlarged abdominal or pelvic lymph nodes. Reproductive: No mass or other significant abnormality. Other: No abdominal wall hernia or abnormality. No abdominopelvic ascites. Musculoskeletal: No acute or significant osseous findings. IMPRESSION: 1. There are multiple thickened loops of mid to distal small bowel in the low abdomen and pelvis. Notably, the terminal ileum is spared. Findings are consistent with nonspecific infectious, inflammatory, or ischemic enteritis. 2. There are multiple hypoenhancing defects of the inferior pole of the right kidney, this appearance suggesting either pyelonephritis or perhaps infarction. Correlate with urinalysis. 3. Thromboembolic shower is a general differential consideration that could produce right kidney findings and small-bowel findings, and central source could be further investigated by echocardiography if desired. Aortic Atherosclerosis (ICD10-I70.0). Electronically Signed   By: Jearld Lesch M.D.   On: 04/25/2021 17:10   DG ABD ACUTE 2+V W 1V CHEST  Result Date: 04/27/2021 CLINICAL DATA:  Nausea vomiting, atrial fibrillation EXAM: DG ABDOMEN ACUTE WITH 1 VIEW CHEST COMPARISON:  None. FINDINGS: Chest: The heart is enlarged. No pleural effusion. No pneumothorax. No mass or consolidation. No acute osseous abnormality. Abdomen: Normal bowel gas pattern. No abnormal calcification or unexpected radiopaque foreign body. Degenerative changes in the lower lumbar spine. IMPRESSION: Chest: Cardiomegaly. No overt edema, effusion or consolidative process in the lungs. Abdomen: No acute radiographic abnormality in the abdomen. Electronically Signed   By: Olive Bass M.D.   On: 04/27/2021 10:38   DG ABD ACUTE 2+V W 1V CHEST  Result Date: 04/26/2021 CLINICAL DATA:   Enteritis. Sepsis. Abdominal pain with nausea and vomiting. EXAM: DG ABDOMEN ACUTE WITH 1 VIEW CHEST COMPARISON:  None. FINDINGS: Cardiac size is not well evaluated on a portable film. Cardiomegaly not excluded. The hila and mediastinum are normal. No pneumothorax. The lungs are clear. No free air, portal venous gas, or pneumatosis. Air seen in nondilated colon. No evidence of obstruction. There is a paucity of small bowel gas. IMPRESSION: No acute abnormalities identified. No evidence of obstruction. No cause for the patient's symptoms noted pan Electronically Signed   By: Gerome Sam III M.D.   On: 04/26/2021 07:29   ECHOCARDIOGRAM COMPLETE  Result Date: 04/26/2021    ECHOCARDIOGRAM REPORT   Patient Name:   Brooke Miles Date of Exam: 04/26/2021 Medical Rec #:  [id number]  Height:       68.0 in Accession #:    [id number] Weight:       220.0 lb Date of Birth:  [birth year]  BSA:          2.128 m Patient Age:    65 years   BP:           121/87 mmHg Patient Gender: F          HR:           114 bpm. Exam Location:  [facility] Procedure: 2D Echo Indications:     Atrial Fibrillation I48.91  History:         Patient has no prior history of Echocardiogram examinations.  Sonographer:     Overton Mam RDCS Referring Phys:  [id number] Floreen Comber PATEL Diagnosing Phys: Yvonne Kendall MD IMPRESSIONS  1. Left ventricular ejection fraction, by estimation, is 30 to 35%. The left ventricle has moderately decreased function. The left ventricle demonstrates global hypokinesis. The left ventricular  internal cavity size was borderline dilated. There is mild  left ventricular hypertrophy. Left ventricular diastolic parameters are indeterminate.  2. Right ventricular systolic function is low normal. The right ventricular size is normal. There is normal pulmonary artery systolic pressure.  3. Left atrial size was severely dilated.  4. The mitral valve is degenerative. Moderate to severe mitral valve regurgitation. No evidence of mitral  stenosis.  5. The aortic valve is tricuspid. There is mild thickening of the aortic valve. Aortic valve regurgitation is not visualized. Mild aortic valve sclerosis is present, with no evidence of aortic valve stenosis.  6. The inferior vena cava is normal in size with greater than 50% respiratory variability, suggesting right atrial pressure of 3 mmHg. FINDINGS  Left Ventricle: Left ventricular ejection fraction, by estimation, is 30 to 35%. The left ventricle has moderately decreased function. The left ventricle demonstrates global hypokinesis. The left ventricular internal cavity size was borderline dilated. There is mild left ventricular hypertrophy. Left ventricular diastolic parameters are indeterminate. Right Ventricle: The right ventricular size is normal. No increase in right ventricular wall thickness. Right ventricular systolic function is low normal. There is normal pulmonary artery systolic pressure. The tricuspid regurgitant velocity is 2.47 m/s,  and with an assumed right atrial pressure of 3 mmHg, the estimated right ventricular systolic pressure is 27.4 mmHg. Left Atrium: Left atrial size was severely dilated. Right Atrium: Right atrial size was normal in size. Pericardium: There is no evidence of pericardial effusion. Mitral Valve: The mitral valve is degenerative in appearance. There is mild thickening of the mitral valve leaflet(s). Moderate to severe mitral valve regurgitation. No evidence of mitral valve stenosis. Tricuspid Valve: The tricuspid valve is normal in structure. Tricuspid valve regurgitation is mild. Aortic Valve: The aortic valve is tricuspid. There is mild thickening of the aortic valve. Aortic valve regurgitation is not visualized. Mild aortic valve sclerosis is present, with no evidence of aortic valve stenosis. Aortic valve peak gradient measures 8.2 mmHg. Pulmonic Valve: The pulmonic valve was grossly normal. Pulmonic valve regurgitation is not visualized. No evidence of  pulmonic stenosis. Aorta: The aortic root and ascending aorta are structurally normal, with no evidence of dilitation. Pulmonary Artery: The pulmonary artery is of normal size. Venous: The inferior vena cava was not well visualized. The inferior vena cava is normal in size with greater than 50% respiratory variability, suggesting right atrial pressure of 3 mmHg. IAS/Shunts: The interatrial septum was not well visualized.  LEFT VENTRICLE PLAX 2D LVIDd:         5.10 cm      Diastology LVIDs:         4.10 cm      LV e' medial:    5.00 cm/s LV PW:         1.30 cm      LV E/e' medial:  20.2 LV IVS:        1.20 cm      LV e' lateral:   10.80 cm/s LVOT diam:     2.10 cm      LV E/e' lateral: 9.4 LV SV:         45 LV SV Index:   21 LVOT Area:     3.46 cm  LV Volumes (MOD) LV vol d, MOD A2C: 98.7 ml LV vol d, MOD A4C: 101.0 ml LV vol s, MOD A2C: 60.6 ml LV vol s, MOD A4C: 58.6 ml LV SV MOD A2C:     38.1 ml LV SV MOD A4C:  101.0 ml LV SV MOD BP:      40.3 ml RIGHT VENTRICLE RV Basal diam:  3.00 cm RV S prime:     13.80 cm/s TAPSE (M-mode): 1.9 cm LEFT ATRIUM              Index       RIGHT ATRIUM           Index LA diam:        4.70 cm  2.21 cm/m  RA Area:     15.50 cm LA Vol (A2C):   110.0 ml 51.69 ml/m RA Volume:   40.50 ml  19.03 ml/m LA Vol (A4C):   114.0 ml 53.57 ml/m LA Biplane Vol: 113.0 ml 53.10 ml/m  AORTIC VALVE                PULMONIC VALVE AV Area (Vmax): 2.02 cm    PV Vmax:       0.96 m/s AV Vmax:        143.00 cm/s PV Peak grad:  3.7 mmHg AV Peak Grad:   8.2 mmHg LVOT Vmax:      83.60 cm/s LVOT Vmean:     60.000 cm/s LVOT VTI:       0.131 m  AORTA Ao Root diam: 2.60 cm Ao Asc diam:  2.90 cm MITRAL VALVE                TRICUSPID VALVE MV Area (PHT): 2.53 cm     TV Peak grad:   21.3 mmHg MV Decel Time: 300 msec     TV Vmax:        2.31 m/s MV E velocity: 101.00 cm/s  TR Peak grad:   24.4 mmHg                             TR Vmax:        247.00 cm/s                              SHUNTS                              Systemic VTI:  0.13 m                             Systemic Diam: 2.10 cm Yvonne Kendall MD Electronically signed by Yvonne Kendall MD Signature Date/Time: 04/26/2021/3:21:26 PM    Final     Anti-infectives: Anti-infectives (From admission, onward)    Start     Dose/Rate Route Frequency Ordered Stop   04/26/21 1200  cefTRIAXone (ROCEPHIN) 2 g in sodium chloride 0.9 % 100 mL IVPB        2 g 200 mL/hr over 30 Minutes Intravenous Every 24 hours 04/26/21 0849     04/25/21 2200  piperacillin-tazobactam (ZOSYN) IVPB 3.375 g  Status:  Discontinued        3.375 g 12.5 mL/hr over 240 Minutes Intravenous Every 8 hours 04/25/21 2017 04/26/21 0849   04/25/21 1830  piperacillin-tazobactam (ZOSYN) IVPB 3.375 g        3.375 g 100 mL/hr over 30 Minutes Intravenous  Once 04/25/21 1822 04/25/21 1858   04/25/21 1715  cefTRIAXone (ROCEPHIN) 2 g in sodium chloride 0.9 % 100 mL IVPB  Status:  Discontinued  2 g 200 mL/hr over 30 Minutes Intravenous Every 24 hours 04/25/21 1714 04/25/21 1822   04/25/21 1715  azithromycin (ZITHROMAX) 500 mg in sodium chloride 0.9 % 250 mL IVPB  Status:  Discontinued        500 mg 250 mL/hr over 60 Minutes Intravenous Every 24 hours 04/25/21 1714 04/25/21 1822       Assessment/Plan: Enteritis ischemic (embolic event) versus infectious.  Currently her abdomen is benign.  Keep on clears for now and a/bs. No need for surgical intervention.  We will obtain another xray in am and continue to follow.  Sterling Big, MD, [facility]  04/27/2021

## 2021-04-27 NOTE — Assessment & Plan Note (Signed)
Mild, asymptomatic, with leukopenia. 

## 2021-04-27 NOTE — Progress Notes (Addendum)
ANTICOAGULATION CONSULT NOTE - Initial Consult  Pharmacy Consult for Heparin Drip Indication: New onset Atrial Fibrillation   Allergies  Allergen Reactions   Quinolones Hives and Itching    Patient Measurements: Height: 5\' 8"  (172.7 cm) Weight: 99.8 kg (220 lb) IBW/kg (Calculated) : 63.9 Heparin Dosing Weight: 85.8 kg  Vital Signs: Temp: 98.2 F (36.8 C) (10/02 0734) Temp Source: Oral (10/02 0734) BP: 115/73 (10/02 0734) Pulse Rate: 103 (10/02 0734)  Labs: Recent Labs    04/25/21 1310 04/25/21 1830 04/25/21 2154 04/26/21 0203 04/26/21 0451 04/26/21 0754  HGB 16.9*  --   --   --  14.9  --   HCT 47.4*  --   --   --  44.8  --   PLT 402*  --   --   --  300  --   APTT  --  27  --   --   --   --   LABPROT  --  15.0  --   --   --   --   INR  --  1.2  --   --   --   --   HEPARINUNFRC  --   --   --  0.50  --  0.41  CREATININE 1.03*  --   --   --  0.83  --   TROPONINIHS 59*  --  63*  --   --   --      Estimated Creatinine Clearance: 84.6 mL/min (by C-G formula based on SCr of 0.83 mg/dL).   Medical History: Past Medical History:  Diagnosis Date   Diabetes mellitus without complication (HCC)    Hyperlipidemia    Hypertension     Assessment: Patient is a 65yo female admitted with sepsis secondary to enteritis. Patient found to have new onset Atrial fibrillation with RVR. Pharmacy originally consulted for heparin infusion on 9/30, then patient was switched to Apixaban. Pharmacy now  consulted for for heparin restart. Last Apixaban dose 10/1 @ 2018.   Goal of Therapy:  aPTT: 0.66-1.02 Heparin level 0.3-0.7 units/ml Monitor platelets by anticoagulation protocol: Yes   Plan:  Will order bolus of heparin 4000 units and restart heparin infusion back at 1250 units/hr. Since patient took two doses of apixaban, will need to dose adjust heparin infusion based on aPTT levels until aPTT and heparin levels (Anti-Xa levels) correlate.  Will order F/U aPTT level 6 hours after  start of infusion. Heparin levels and CBCs daily while on heparin per protocol.   [id number], PharmD, BCPS Clinical Pharmacist 04/27/2021 9:32 AM

## 2021-04-27 NOTE — Assessment & Plan Note (Signed)
Hyperglycemic on admission without evidence of DKA/HHS. Glucoses better controlled now - Hold metformin - Continue SSI

## 2021-04-27 NOTE — Assessment & Plan Note (Addendum)
CHA2DS2-Vasc 4 for   gender, hypertension, CHF, and diabetes.   HR improved -Continue Eliquis -Continue digoxin, Coreg

## 2021-04-27 NOTE — Assessment & Plan Note (Signed)
Keep >2 - Repeat Mag suppl

## 2021-04-27 NOTE — Assessment & Plan Note (Addendum)
Seemed to be completely without pain last night and this morning, then advanced to solid diet and developed worsening abdominal pain and vomiting.  Discharge canceled.  - Back to clears only - Continue Eliquis - Continue IV antibiotics

## 2021-04-27 NOTE — Assessment & Plan Note (Signed)
Presented with acute onset generalized abdominal pain.  Lactate elevated, and CT showing thickened bowel, consistent with enteritis, ischemic vs infectious.    Again, because of language barrier and patient's minimization, diagnosis is difficult, but I favor that the hyperacute onset, elevated lactate without classic sepsis appearance, and CT findings represent an embolic event and that her pain is from enteric ischemia, but seems to be improving.  (The alternative, that this is pyelonephritis and severe sepsis, seems less likely to me from examining her and doesn't explain the thickened bowel loops).  - Bowel rest - IV heparin - Consult General Surgery, appreciate cares

## 2021-04-27 NOTE — Assessment & Plan Note (Signed)
Troponin minimally elevated at 59, likely demand ischemia in setting of A. fib with RVR and sepsis.   

## 2021-04-27 NOTE — Assessment & Plan Note (Signed)
Kidney findings in addition to small bowel findings on CT imaging poses question of thromboembolic showering.   I think this is more likely than pyelonephritis

## 2021-04-27 NOTE — Assessment & Plan Note (Signed)
Keep >4 - Repeat K supplement

## 2021-04-27 NOTE — Assessment & Plan Note (Signed)
Evidently this is new.  CHA2DS2-VASc 4 for gender, hypertension, CHF, and diabetes.  TSH normal.  She was initially started on heparin, transitioned yesterday to Eliquis, until her Echo showed reduced EF, mitral regurg, and her lactate (which was ordered at 7:51am but not drawn until 6:59pm) resulted with lactate 4.  I will resume heparin, until her abdominal pain is clearly improving and plans for anticoagulation from Cardiology can be finalized.  - Resume IV heparin - Consult Cardiology re: reduced EF, MR, and Afib - Continue Coreg, titrate up

## 2021-04-27 NOTE — Consult Note (Signed)
Cardiology Consultation:   Patient ID: Brooke Miles MRN: [id number]; DOB: February 04, 1956  Admit date: 04/25/2021 Date of Consult: 04/27/2021  PCP:  Pcp, No   [facility] Providers Cardiologist:NEW     Patient Profile:   Brooke Miles is a 65 y.o. female with a hx of HTN DM (remote) Hypercalcemia remote ( reported CE at [facility])  who is being seen 04/27/2021 having been admitted for abd pain, NV fever, CT >>enteritis and Hypoenhancing lesions in kidney concerning for pyelo vs infarction for the evaluation of newly identified AFib and depressed LV function ( EF 35%  at the request of Dr Maryfrances Bunnell.  History of Present Illness:   Brooke Miles is from [place] and presented with abdominal pain as above.  She has a 1 year history of progressive dyspnea on exertion accompanied by chest discomfort and I think, (communication was not ideal) palpitations.  She has over the last year become quite sedentary.  Has not had peripheral edema or nocturnal dyspnea.  No transient neurological losses    She is prone to frequent diaphoresis   DATE TEST EF   10//22 Echo   30-35 % LAE severe ( 53cc/m2) MR mod-severe         Date Cr K Hgb  10/22 0.83 3.4<<3.1 14.9          Thromboembolic risk factors (, HTN-1, TIA/CVA-2, DM-1, CHF-1, Gender-1) for a CHADSVASc Score of >=4-6    Past Medical History:  Diagnosis Date   Diabetes mellitus without complication (HCC)    Hyperlipidemia    Hypertension     Past Surgical History:  Procedure Laterality Date   CESAREAN SECTION         Inpatient Medications: Scheduled Meds:  carvedilol  12.5 mg Oral BID WC   insulin aspart  0-15 Units Subcutaneous Q4H   sodium chloride flush  3 mL Intravenous Q12H   Continuous Infusions:  cefTRIAXone (ROCEPHIN)  IV Stopped (04/26/21 1407)   heparin     magnesium sulfate 1 g (04/27/21 1046)   PRN Meds: metoprolol tartrate, ondansetron **OR** ondansetron (ZOFRAN) IV  Allergies:    Allergies  Allergen Reactions    Quinolones Hives and Itching    Social History:   Social History   Socioeconomic History   Marital status: Married    Spouse name: Not on file   Number of children: Not on file   Years of education: Not on file   Highest education level: Not on file  Occupational History   Not on file  Tobacco Use   Smoking status: Never   Smokeless tobacco: Never  Substance and Sexual Activity   Alcohol use: Not Currently   Drug use: Not on file   Sexual activity: Not on file  Other Topics Concern   Not on file  Social History Narrative   Not on file   Social Determinants of Health   Financial Resource Strain: Not on file  Food Insecurity: Not on file  Transportation Needs: Not on file  Physical Activity: Not on file  Stress: Not on file  Social Connections: Not on file  Intimate Partner Violence: Not on file    Family History:   No family history on file.   ROS:  Please see the history of present illness.   All other ROS reviewed and negative.     Physical Exam/Data:   Vitals:   04/26/21 1513 04/26/21 2004 04/27/21 0405 04/27/21 0734  BP: (!) 134/95 (!) 128/104 128/86 115/73  Pulse: (!) 101 94  96 (!) 103  Resp: 18 20 18 18   Temp: 98.7 F (37.1 C) 97.8 F (36.6 C) 99.3 F (37.4 C) 98.2 F (36.8 C)  TempSrc: Oral   Oral  SpO2: 97% 100% 98% 100%  Weight:      Height:        Intake/Output Summary (Last 24 hours) at 04/27/2021 1123 Last data filed at 04/27/2021 1011 Gross per 24 hour  Intake 943.55 ml  Output --  Net 943.55 ml   Last 3 Weights 04/25/2021  Weight (lbs) 220 lb  Weight (kg) 99.791 kg     Body mass index is 33.45 kg/m.  General:  Well nourished, well developed, in no acute distress  HEENT: normal Neck: 8-10 Vascular: No carotid bruits; Distal pulses 2+ bilaterally Cardiac:  n irregularly irregular rhythm with a normal S1-S2 with a faint murmur heard left laterally Lungs:  clear to auscultation bilaterally, no wheezing, rhonchi or rales  Abd:  soft, nontender, no hepatomegaly  Ext: no edema Musculoskeletal:  No deformities, BUE and BLE strength normal and equal Skin: warm and diaphoretic neuro:  CNs 2-12 intact, no focal abnormalities noted Psych:  Normal affect   EKG:  The EKG was personally reviewed and demonstrates:  AFib 128  Telemetry:  Telemetry was personally reviewed and demonstrates: Atrial fibrillation with an average rate of about 100 increasing to 120s or 130s periodically and I suspect with minimal activity  Relevant CV Studies: Echo as above  Laboratory Data:  High Sensitivity Troponin:   Recent Labs  Lab 04/25/21 1310 04/25/21 2154  TROPONINIHS 59* 63*     Chemistry Recent Labs  Lab 04/25/21 1310 04/26/21 0451 04/27/21 0826 04/27/21 1005  NA 132* 135 134*  --   K 2.8* 3.1* 3.4*  --   CL 88* 97* 96*  --   CO2 29 28 25   --   GLUCOSE 317* 144* 130*  --   BUN 27* 18 13  --   CREATININE 1.03* 0.83 0.77  --   CALCIUM 10.2 8.9 9.2  --   MG 1.5* 1.7  --  1.8  GFRNONAA >60 >60 >60  --   ANIONGAP 15 10 13   --     Recent Labs  Lab 04/25/21 1310  PROT 8.4*  ALBUMIN 3.8  AST 31  ALT 17  ALKPHOS 67  BILITOT 1.2   Lipids No results for input(s): CHOL, TRIG, HDL, LABVLDL, LDLCALC, CHOLHDL in the last 168 hours.  Hematology Recent Labs  Lab 04/25/21 1310 04/26/21 0451 04/27/21 1005  WBC 16.2* 15.4* 15.8*  RBC 5.80* 5.18* 4.99  HGB 16.9* 14.9 14.8  HCT 47.4* 44.8 41.7  MCV 81.7 86.5 83.6  MCH 29.1 28.8 29.7  MCHC 35.7 33.3 35.5  RDW 13.6 13.9 13.7  PLT 402* 300 310   Thyroid  Recent Labs  Lab 04/25/21 1310  TSH 0.842  FREET4 1.13*    BNPNo results for input(s): BNP, PROBNP in the last 168 hours.  DDimer No results for input(s): DDIMER in the last 168 hours.   Radiology/Studies:  CT Angio Chest PE W and/or Wo Contrast  Result Date: 04/25/2021 CLINICAL DATA:  PE suspected EXAM: CT ANGIOGRAPHY CHEST WITH CONTRAST TECHNIQUE: Multidetector CT imaging of the chest was performed using  the standard protocol during bolus administration of intravenous contrast. Multiplanar CT image reconstructions and MIPs were obtained to evaluate the vascular anatomy. CONTRAST:  56mL OMNIPAQUE IOHEXOL 350 MG/ML SOLN COMPARISON:  None. FINDINGS: Cardiovascular: Satisfactory opacification of the pulmonary arteries  to the segmental level. No evidence of pulmonary embolism. Cardiomegaly. Left coronary artery calcifications. No pericardial effusion. Mediastinum/Nodes: No enlarged mediastinal, hilar, or axillary lymph nodes. Thyroid gland, trachea, and esophagus demonstrate no significant findings. Lungs/Pleura: Nonspecific infectious or inflammatory ground-glass airspace disease of the right lung base (series 7, image 57). No pleural effusion or pneumothorax. Upper Abdomen: Please see separately reported examination of the abdomen and pelvis. Musculoskeletal: No chest wall abnormality. No acute or significant osseous findings. Review of the MIP images confirms the above findings. IMPRESSION: 1. Negative examination for pulmonary embolism. 2. Nonspecific infectious or inflammatory ground-glass airspace disease of the right lung base. 3. Cardiomegaly and coronary artery disease. Electronically Signed   By: Jearld Lesch M.D.   On: 04/25/2021 17:04   CT ABDOMEN PELVIS W CONTRAST  Result Date: 04/25/2021 CLINICAL DATA:  Abdominal distension, upper abdominal pain, nausea, vomiting EXAM: CT ABDOMEN AND PELVIS WITH CONTRAST TECHNIQUE: Multidetector CT imaging of the abdomen and pelvis was performed using the standard protocol following bolus administration of intravenous contrast. CONTRAST:  42mL OMNIPAQUE IOHEXOL 350 MG/ML SOLN COMPARISON:  None. FINDINGS: Lower chest: No acute abnormality. Hepatobiliary: No solid liver abnormality is seen. No gallstones, gallbladder wall thickening, or biliary dilatation. Pancreas: Unremarkable. No pancreatic ductal dilatation or surrounding inflammatory changes. Spleen: Normal in size  without significant abnormality. Adrenals/Urinary Tract: Adrenal glands are unremarkable. There are multiple hypoenhancing defects of the inferior pole of the right kidney (series 6, image 53). The left kidney is normal, without renal calculi, solid lesion, or hydronephrosis. Bladder is unremarkable. Stomach/Bowel: Stomach is within normal limits. Appendix appears normal. There are multiple thickened loops of mid to distal small bowel in the low abdomen and pelvis (series 3, image 77). Notably, the terminal ileum is spared. Vascular/Lymphatic: Aortic atherosclerosis. No enlarged abdominal or pelvic lymph nodes. Reproductive: No mass or other significant abnormality. Other: No abdominal wall hernia or abnormality. No abdominopelvic ascites. Musculoskeletal: No acute or significant osseous findings. IMPRESSION: 1. There are multiple thickened loops of mid to distal small bowel in the low abdomen and pelvis. Notably, the terminal ileum is spared. Findings are consistent with nonspecific infectious, inflammatory, or ischemic enteritis. 2. There are multiple hypoenhancing defects of the inferior pole of the right kidney, this appearance suggesting either pyelonephritis or perhaps infarction. Correlate with urinalysis. 3. Thromboembolic shower is a general differential consideration that could produce right kidney findings and small-bowel findings, and central source could be further investigated by echocardiography if desired. Aortic Atherosclerosis (ICD10-I70.0). Electronically Signed   By: Jearld Lesch M.D.   On: 04/25/2021 17:10   DG ABD ACUTE 2+V W 1V CHEST  Result Date: 04/27/2021 CLINICAL DATA:  Nausea vomiting, atrial fibrillation EXAM: DG ABDOMEN ACUTE WITH 1 VIEW CHEST COMPARISON:  None. FINDINGS: Chest: The heart is enlarged. No pleural effusion. No pneumothorax. No mass or consolidation. No acute osseous abnormality. Abdomen: Normal bowel gas pattern. No abnormal calcification or unexpected radiopaque  foreign body. Degenerative changes in the lower lumbar spine. IMPRESSION: Chest: Cardiomegaly. No overt edema, effusion or consolidative process in the lungs. Abdomen: No acute radiographic abnormality in the abdomen. Electronically Signed   By: Olive Bass M.D.   On: 04/27/2021 10:38   DG ABD ACUTE 2+V W 1V CHEST  Result Date: 04/26/2021 CLINICAL DATA:  Enteritis. Sepsis. Abdominal pain with nausea and vomiting. EXAM: DG ABDOMEN ACUTE WITH 1 VIEW CHEST COMPARISON:  None. FINDINGS: Cardiac size is not well evaluated on a portable film. Cardiomegaly not excluded. The hila and mediastinum  are normal. No pneumothorax. The lungs are clear. No free air, portal venous gas, or pneumatosis. Air seen in nondilated colon. No evidence of obstruction. There is a paucity of small bowel gas. IMPRESSION: No acute abnormalities identified. No evidence of obstruction. No cause for the patient's symptoms noted pan Electronically Signed   By: Gerome Sam III M.D.   On: 04/26/2021 07:29   ECHOCARDIOGRAM COMPLETE  Result Date: 04/26/2021    ECHOCARDIOGRAM REPORT   Patient Name:   Brooke Miles Date of Exam: 04/26/2021 Medical Rec #:  [id number]  Height:       68.0 in Accession #:    [id number] Weight:       220.0 lb Date of Birth:  Jul 31, 1955  BSA:          2.128 m Patient Age:    65 years   BP:           121/87 mmHg Patient Gender: F          HR:           114 bpm. Exam Location:  [facility] Procedure: 2D Echo Indications:     Atrial Fibrillation I48.91  History:         Patient has no prior history of Echocardiogram examinations.  Sonographer:     Overton Mam RDCS Referring Phys:  [id number] Floreen Comber PATEL Diagnosing Phys: Yvonne Kendall MD IMPRESSIONS  1. Left ventricular ejection fraction, by estimation, is 30 to 35%. The left ventricle has moderately decreased function. The left ventricle demonstrates global hypokinesis. The left ventricular internal cavity size was borderline dilated. There is mild  left ventricular  hypertrophy. Left ventricular diastolic parameters are indeterminate.  2. Right ventricular systolic function is low normal. The right ventricular size is normal. There is normal pulmonary artery systolic pressure.  3. Left atrial size was severely dilated.  4. The mitral valve is degenerative. Moderate to severe mitral valve regurgitation. No evidence of mitral stenosis.  5. The aortic valve is tricuspid. There is mild thickening of the aortic valve. Aortic valve regurgitation is not visualized. Mild aortic valve sclerosis is present, with no evidence of aortic valve stenosis.  6. The inferior vena cava is normal in size with greater than 50% respiratory variability, suggesting right atrial pressure of 3 mmHg. FINDINGS  Left Ventricle: Left ventricular ejection fraction, by estimation, is 30 to 35%. The left ventricle has moderately decreased function. The left ventricle demonstrates global hypokinesis. The left ventricular internal cavity size was borderline dilated. There is mild left ventricular hypertrophy. Left ventricular diastolic parameters are indeterminate. Right Ventricle: The right ventricular size is normal. No increase in right ventricular wall thickness. Right ventricular systolic function is low normal. There is normal pulmonary artery systolic pressure. The tricuspid regurgitant velocity is 2.47 m/s,  and with an assumed right atrial pressure of 3 mmHg, the estimated right ventricular systolic pressure is 27.4 mmHg. Left Atrium: Left atrial size was severely dilated. Right Atrium: Right atrial size was normal in size. Pericardium: There is no evidence of pericardial effusion. Mitral Valve: The mitral valve is degenerative in appearance. There is mild thickening of the mitral valve leaflet(s). Moderate to severe mitral valve regurgitation. No evidence of mitral valve stenosis. Tricuspid Valve: The tricuspid valve is normal in structure. Tricuspid valve regurgitation is mild. Aortic Valve: The aortic  valve is tricuspid. There is mild thickening of the aortic valve. Aortic valve regurgitation is not visualized. Mild aortic valve sclerosis is present, with no evidence of aortic valve  stenosis. Aortic valve peak gradient measures 8.2 mmHg. Pulmonic Valve: The pulmonic valve was grossly normal. Pulmonic valve regurgitation is not visualized. No evidence of pulmonic stenosis. Aorta: The aortic root and ascending aorta are structurally normal, with no evidence of dilitation. Pulmonary Artery: The pulmonary artery is of normal size. Venous: The inferior vena cava was not well visualized. The inferior vena cava is normal in size with greater than 50% respiratory variability, suggesting right atrial pressure of 3 mmHg. IAS/Shunts: The interatrial septum was not well visualized.  LEFT VENTRICLE PLAX 2D LVIDd:         5.10 cm      Diastology LVIDs:         4.10 cm      LV e' medial:    5.00 cm/s LV PW:         1.30 cm      LV E/e' medial:  20.2 LV IVS:        1.20 cm      LV e' lateral:   10.80 cm/s LVOT diam:     2.10 cm      LV E/e' lateral: 9.4 LV SV:         45 LV SV Index:   21 LVOT Area:     3.46 cm  LV Volumes (MOD) LV vol d, MOD A2C: 98.7 ml LV vol d, MOD A4C: 101.0 ml LV vol s, MOD A2C: 60.6 ml LV vol s, MOD A4C: 58.6 ml LV SV MOD A2C:     38.1 ml LV SV MOD A4C:     101.0 ml LV SV MOD BP:      40.3 ml RIGHT VENTRICLE RV Basal diam:  3.00 cm RV S prime:     13.80 cm/s TAPSE (M-mode): 1.9 cm LEFT ATRIUM              Index       RIGHT ATRIUM           Index LA diam:        4.70 cm  2.21 cm/m  RA Area:     15.50 cm LA Vol (A2C):   110.0 ml 51.69 ml/m RA Volume:   40.50 ml  19.03 ml/m LA Vol (A4C):   114.0 ml 53.57 ml/m LA Biplane Vol: 113.0 ml 53.10 ml/m  AORTIC VALVE                PULMONIC VALVE AV Area (Vmax): 2.02 cm    PV Vmax:       0.96 m/s AV Vmax:        143.00 cm/s PV Peak grad:  3.7 mmHg AV Peak Grad:   8.2 mmHg LVOT Vmax:      83.60 cm/s LVOT Vmean:     60.000 cm/s LVOT VTI:       0.131 m  AORTA Ao  Root diam: 2.60 cm Ao Asc diam:  2.90 cm MITRAL VALVE                TRICUSPID VALVE MV Area (PHT): 2.53 cm     TV Peak grad:   21.3 mmHg MV Decel Time: 300 msec     TV Vmax:        2.31 m/s MV E velocity: 101.00 cm/s  TR Peak grad:   24.4 mmHg                             TR Vmax:  247.00 cm/s                              SHUNTS                             Systemic VTI:  0.13 m                             Systemic Diam: 2.10 cm Yvonne Kendall MD Electronically signed by Yvonne Kendall MD Signature Date/Time: 04/26/2021/3:21:26 PM    Final      Assessment and Plan:   Atrial fibrillation-persistent Mitral regurgitation moderate-severe Left atrial enlargement-severe Possible thromboembolic lesions in the kidney and possibly the explanation for her diffuse enteritis Abdominal pain/enteritis Hypertension Diabetes Remote hypercalcemia  The patient presented with abdominal pain, radiology was concerned that the hypodense lesions in her kidney and possibly the distribution of her enteritis were consistent with thromboembolic disease.  She is also identified as having atrial fibrillation and by history may be have been persistent for the last year or so given associated chest discomfort and palpitations with exertion. The timing is hard to know but she does have severe left atrial enlargement occurring in the context of mitral regurgitation.  It is not clear whether the mitral regurgitation is secondary to the atrial fibrillation with annular dilatation or has a primary cause.  This will need to be elucidated. May need TEE   Suspect her exertional chest pain is more related to her atrial fibrillation, although with her diabetes and hypertension her pretest likelihood of coronary disease is not trivial.  My view would probably be most appropriate, could be done either as an inpatient or outpatient  For now, would continue anticoagulation with heparin transitioning to a NOAC when appropriate She  remains rapid with her atrial fibrillation,continue her carvedilol and will also add low-dose digoxin. If hemodynamics are stable tomorrow can begin to add other guideline directed medical therapy.  Hypokalemia likely related to outpatient chlorthalidone -- we will begin her on spironolactone      Risk Assessment/Risk Scores:           For questions or updates, please contact [facility] Please consult [email or web address] for contact info under    Signed, Sherryl Manges, MD  04/27/2021 11:23 AM

## 2021-04-28 ENCOUNTER — Inpatient Hospital Stay: Payer: Self-pay

## 2021-04-28 ENCOUNTER — Other Ambulatory Visit: Payer: Self-pay

## 2021-04-28 LAB — HEPARIN LEVEL (UNFRACTIONATED): Heparin Unfractionated: 0.81 IU/mL — ABNORMAL HIGH (ref 0.30–0.70)

## 2021-04-28 LAB — HEMOGLOBIN A1C
Hgb A1c MFr Bld: 8.1 % — ABNORMAL HIGH (ref 4.8–5.6)
Mean Plasma Glucose: 186 mg/dL

## 2021-04-28 LAB — BASIC METABOLIC PANEL
Anion gap: 5 (ref 5–15)
BUN: 12 mg/dL (ref 8–23)
CO2: 29 mmol/L (ref 22–32)
Calcium: 8.6 mg/dL — ABNORMAL LOW (ref 8.9–10.3)
Chloride: 100 mmol/L (ref 98–111)
Creatinine, Ser: 0.78 mg/dL (ref 0.44–1.00)
GFR, Estimated: >60 mL/min (ref >60)
Glucose, Bld: 99 mg/dL (ref 70–99)
Potassium: 3.2 mmol/L — ABNORMAL LOW (ref 3.5–5.1)
Sodium: 134 mmol/L — ABNORMAL LOW (ref 135–145)

## 2021-04-28 LAB — GLUCOSE, CAPILLARY
Glucose-Capillary: 103 mg/dL — ABNORMAL HIGH (ref 70–99)
Glucose-Capillary: 109 mg/dL — ABNORMAL HIGH (ref 70–99)
Glucose-Capillary: 177 mg/dL — ABNORMAL HIGH (ref 70–99)
Glucose-Capillary: 273 mg/dL — ABNORMAL HIGH (ref 70–99)
Glucose-Capillary: 276 mg/dL — ABNORMAL HIGH (ref 70–99)
Glucose-Capillary: 80 mg/dL (ref 70–99)

## 2021-04-28 LAB — CBC
HCT: 37.3 % (ref 36.0–46.0)
Hemoglobin: 13.1 g/dL (ref 12.0–15.0)
MCH: 29.4 pg (ref 26.0–34.0)
MCHC: 35.1 g/dL (ref 30.0–36.0)
MCV: 83.6 fL (ref 80.0–100.0)
Platelets: 284 10*3/uL (ref 150–400)
RBC: 4.46 MIL/uL (ref 3.87–5.11)
RDW: 13.4 % (ref 11.5–15.5)
WBC: 12 10*3/uL — ABNORMAL HIGH (ref 4.0–10.5)
nRBC: 0 % (ref 0.0–0.2)

## 2021-04-28 LAB — MAGNESIUM: Magnesium: 1.9 mg/dL (ref 1.7–2.4)

## 2021-04-28 LAB — APTT: aPTT: 90 seconds — ABNORMAL HIGH (ref 24–36)

## 2021-04-28 MED ORDER — OXYCODONE HCL 5 MG PO TABS
5.0000 mg | ORAL_TABLET | Freq: Four times a day (QID) | ORAL | Status: DC | PRN
  Administered 2021-04-28: 5 mg via ORAL
  Filled 2021-04-28: qty 1

## 2021-04-28 MED ORDER — SPIRONOLACTONE 25 MG PO TABS
25.0000 mg | ORAL_TABLET | Freq: Every day | ORAL | Status: DC
  Administered 2021-04-28 – 2021-04-29 (×2): 25 mg via ORAL
  Filled 2021-04-28 (×2): qty 1

## 2021-04-28 MED ORDER — APIXABAN 5 MG PO TABS
5.0000 mg | ORAL_TABLET | Freq: Two times a day (BID) | ORAL | 5 refills | Status: DC
  Filled 2021-04-28 (×2): qty 60, 30d supply, fill #0

## 2021-04-28 MED ORDER — LOSARTAN POTASSIUM 25 MG PO TABS
25.0000 mg | ORAL_TABLET | Freq: Every day | ORAL | Status: DC
  Administered 2021-04-28 – 2021-04-29 (×2): 25 mg via ORAL
  Filled 2021-04-28 (×2): qty 1

## 2021-04-28 MED ORDER — LOSARTAN POTASSIUM 25 MG PO TABS
25.0000 mg | ORAL_TABLET | Freq: Every day | ORAL | 5 refills | Status: DC
  Filled 2021-04-28 (×2): qty 30, 30d supply, fill #0

## 2021-04-28 MED ORDER — POTASSIUM CHLORIDE CRYS ER 20 MEQ PO TBCR
40.0000 meq | EXTENDED_RELEASE_TABLET | Freq: Once | ORAL | Status: AC
  Administered 2021-04-28: 40 meq via ORAL
  Filled 2021-04-28: qty 2

## 2021-04-28 MED ORDER — ACETAMINOPHEN 325 MG PO TABS: 650.0000 mg | ORAL_TABLET | Freq: Four times a day (QID) | ORAL | Status: DC | PRN

## 2021-04-28 MED ORDER — INSULIN ASPART 100 UNIT/ML IJ SOLN: 0.0000 [IU] | Freq: Every day | INTRAMUSCULAR | Status: DC

## 2021-04-28 MED ORDER — APIXABAN 5 MG PO TABS
5.0000 mg | ORAL_TABLET | Freq: Two times a day (BID) | ORAL | Status: DC
  Administered 2021-04-28 – 2021-04-29 (×3): 5 mg via ORAL
  Filled 2021-04-28 (×3): qty 1

## 2021-04-28 MED ORDER — SPIRONOLACTONE 25 MG PO TABS
25.0000 mg | ORAL_TABLET | Freq: Every day | ORAL | 5 refills | Status: DC
  Filled 2021-04-28 (×2): qty 30, 30d supply, fill #0

## 2021-04-28 MED ORDER — MAGNESIUM OXIDE -MG SUPPLEMENT 400 (240 MG) MG PO TABS
400.0000 mg | ORAL_TABLET | Freq: Two times a day (BID) | ORAL | Status: AC
  Administered 2021-04-28 (×2): 400 mg via ORAL
  Filled 2021-04-28 (×2): qty 1

## 2021-04-28 MED ORDER — SODIUM CHLORIDE 0.9 % IV SOLN
12.5000 mg | Freq: Four times a day (QID) | INTRAVENOUS | Status: DC | PRN
  Filled 2021-04-28: qty 0.5

## 2021-04-28 MED ORDER — CARVEDILOL 25 MG PO TABS
25.0000 mg | ORAL_TABLET | Freq: Two times a day (BID) | ORAL | 5 refills | Status: AC
  Filled 2021-04-28 (×2): qty 60, 30d supply, fill #0

## 2021-04-28 MED ORDER — INSULIN ASPART 100 UNIT/ML IJ SOLN
0.0000 [IU] | Freq: Three times a day (TID) | INTRAMUSCULAR | Status: DC
  Administered 2021-04-28: 8 [IU] via SUBCUTANEOUS
  Administered 2021-04-29: 5 [IU] via SUBCUTANEOUS
  Administered 2021-04-29: 3 [IU] via SUBCUTANEOUS
  Filled 2021-04-28 (×3): qty 1

## 2021-04-28 MED ORDER — DIGOXIN 125 MCG PO TABS
0.1250 mg | ORAL_TABLET | Freq: Every day | ORAL | 5 refills | Status: DC
  Filled 2021-04-28 (×2): qty 30, 30d supply, fill #0

## 2021-04-28 NOTE — Progress Notes (Signed)
[facility] SURGICAL PROGRESS NOTE (cpt [phone])  Hospital Day(s): 3.   Interval History: Patient seen and examined, no acute events or new complaints overnight. Patient reports she is feeling much better this morning.She reports her abdominal pain is resolved. She denies fever, chills, nausea, emesis. Leukocytosis is improving; down to 12.0K. renal function is normal; sCr - 0.78; UO - unmeasured. Hypokalemia to 3.2. KUB + CXR with normal gas pattern, no free air. She has been on CLD; she is tolerating this well. No other issues.    Review of Systems:  Constitutional: denies fever, chills  HEENT: denies cough or congestion  Respiratory: denies any shortness of breath  Cardiovascular: denies chest pain or palpitations  Gastrointestinal: denies abdominal pain, N/V, or diarrhea/and bowel function as per interval history Genitourinary: denies burning with urination or urinary frequency  Vital signs in last 24 hours: [min-max] current  Temp:  [97.7 F (36.5 C)-99 F (37.2 C)] 97.7 F (36.5 C) (10/03 0802) Pulse Rate:  [87-90] 90 (10/03 0802) Resp:  [18-20] 20 (10/03 0802) BP: (113-134)/(87-99) 134/99 (10/03 0802) SpO2:  [98 %-100 %] 100 % (10/03 0802)     Height: 5\' 8"  (172.7 cm) Weight: 99.8 kg BMI (Calculated): 33.46   Intake/Output last 2 shifts:  10/02 0701 - 10/03 0700 In: 521 [P.O.:240; I.V.:81.9; IV Piggyback:199.1] Out: -    Physical Exam:  Constitutional: alert, cooperative and no distress  HENT: normocephalic without obvious abnormality  Eyes: PERRL, EOM's grossly intact and symmetric   Respiratory: breathing non-labored at rest  Cardiovascular: regular rate and sinus rhythm  Gastrointestinal: Obese, soft, non-tender, non-distended, no rebound/guarding  Musculoskeletal: no edema or wounds, motor and sensation grossly intact, NT    Labs:  CBC Latest Ref Rng & Units 04/28/2021 04/27/2021 04/26/2021  WBC 4.0 - 10.5 K/uL 12.0(H) 15.8(H) 15.4(H)  Hemoglobin  12.0 - 15.0 g/dL 06/26/2021 54.2 70.6  Hematocrit 36.0 - 46.0 % 37.3 41.7 44.8  Platelets 150 - 400 K/uL 284 310 300   CMP Latest Ref Rng & Units 04/28/2021 04/27/2021 04/26/2021  Glucose 70 - 99 mg/dL 99 06/26/2021) 628(B)  BUN 8 - 23 mg/dL 12 13 18   Creatinine 0.44 - 1.00 mg/dL 151(V 6.16  Sodium 135 - 145 mmol/L 134(L) 134(L) 135  Potassium 3.5 - 5.1 mmol/L 3.2(L) 3.4(L) 3.1(L)  Chloride 98 - 111 mmol/L 100 96(L) 97(L)  CO2 22 - 32 mmol/L 29 25 28   Calcium 8.9 - 10.3 mg/dL 0.73) 9.2 8.9  Total Protein 6.5 - 8.1 g/dL - - -  Total Bilirubin 0.3 - 1.2 mg/dL - - -  Alkaline Phos 38 - 126 U/L - - -  AST 15 - 41 U/L - - -  ALT 0 - 44 U/L - - -     Imaging studies:   CXR + KUB (04/28/2021) personally reviewed without acute cardiopulmonary findings, normal bowel gas pattern, no free air, and radiologist report pending   Assessment/Plan: (ICD-10's: K52.9) 65 y.o. female with improving leukocytosis and resolved abdominal pain admitted with ischemic (embolic) vs infectious enteritis   - Okay to advance to full liquid diet; ADAT   - Wean from IVF resuscitation as diet advancing  - Monitor abdominal examination +/- serial KUBs  - Continue IV Abx (Rocephin) - Pain control prn; antiemetics prn - No emergent surgical interventions - Mobilization as tolerated   - Further management per primary service; we will follow    All of the above findings and recommendations were discussed with the patient, and  the medical team, and all of patient's questions were answered to her expressed satisfaction.  -- Lynden Oxford, PA-C [facility] 04/28/2021, 9:07 AM [phone] M-F: 7am - 4pm

## 2021-04-28 NOTE — Assessment & Plan Note (Signed)
BP normal - Stop home amlodipine, chlorthalidone - Continue Coreg, losartan, spironolactone

## 2021-04-28 NOTE — Assessment & Plan Note (Signed)
EF found this hospitalization to be 30 to 35% with moderate to severe mitral regurgitation.   Appears euvolemic - Continue carvedilol, losartan, spironolactone - Outpatietn cardiology follow up

## 2021-04-28 NOTE — Progress Notes (Signed)
Progress Note  Patient Name: Brooke Miles Date of Encounter: 04/28/2021  [facility] Cardiologist: new   Subjective   She feels better overall and reports resolution of abdominal pain.  She is not aware that she had atrial fibrillation or congestive heart failure in the past but has not seen a physician in a long time.  Inpatient Medications    Scheduled Meds:  carvedilol  25 mg Oral BID WC   [START ON 04/29/2021] digoxin  0.125 mg Oral Daily   insulin aspart  0-15 Units Subcutaneous Q4H   losartan  25 mg Oral Daily   magnesium oxide  400 mg Oral BID   sodium chloride flush  3 mL Intravenous Q12H   spironolactone  25 mg Oral Daily   Continuous Infusions:  cefTRIAXone (ROCEPHIN)  IV Stopped (04/27/21 1230)   heparin 1,250 Units/hr (04/28/21 0402)   PRN Meds: melatonin, ondansetron **OR** ondansetron (ZOFRAN) IV   Vital Signs    Vitals:   04/27/21 0734 04/27/21 1519 04/28/21 0401 04/28/21 0802  BP: 115/73 (!) 113/97 127/87 (!) 134/99  Pulse: (!) 103 87 90 90  Resp: 18 18 20 20   Temp: 98.2 F (36.8 C) 99 F (37.2 C) 98.3 F (36.8 C) 97.7 F (36.5 C)  TempSrc: Oral Oral  Oral  SpO2: 100% 100% 98% 100%  Weight:      Height:        Intake/Output Summary (Last 24 hours) at 04/28/2021 1023 Last data filed at 04/27/2021 1522 Gross per 24 hour  Intake 280.97 ml  Output --  Net 280.97 ml   Last 3 Weights 04/25/2021  Weight (lbs) 220 lb  Weight (kg) 99.791 kg      Telemetry    Atrial fibrillation with ventricular rate around 75 bpm- Personally Reviewed  ECG     - Personally Reviewed  Physical Exam   GEN: No acute distress.   Neck: No JVD Cardiac: Irregularly irregular, no murmurs, rubs, or gallops.  Respiratory: Clear to auscultation bilaterally. GI: Soft, nontender, non-distended  MS: No edema; No deformity. Neuro:  Nonfocal  Psych: Normal affect   Labs    High Sensitivity Troponin:   Recent Labs  Lab 04/25/21 1310 04/25/21 2154  TROPONINIHS  59* 63*     Chemistry Recent Labs  Lab 04/25/21 1310 04/26/21 0451 04/27/21 0826 04/27/21 1005 04/28/21 0504  NA 132* 135 134*  --  134*  K 2.8* 3.1* 3.4*  --  3.2*  CL 88* 97* 96*  --  100  CO2 29 28 25   --  29  GLUCOSE 317* 144* 130*  --  99  BUN 27* 18 13  --  12  CREATININE 1.03* 0.83 0.77  --  0.78  CALCIUM 10.2 8.9 9.2  --  8.6*  MG 1.5* 1.7  --  1.8 1.9  PROT 8.4*  --   --   --   --   ALBUMIN 3.8  --   --   --   --   AST 31  --   --   --   --   ALT 17  --   --   --   --   ALKPHOS 67  --   --   --   --   BILITOT 1.2  --   --   --   --   GFRNONAA >60 >60 >60  --  >60  ANIONGAP 15 10 13   --  5    Lipids No results for  input(s): CHOL, TRIG, HDL, LABVLDL, LDLCALC, CHOLHDL in the last 168 hours.  Hematology Recent Labs  Lab 04/26/21 0451 04/27/21 1005 04/28/21 0504  WBC 15.4* 15.8* 12.0*  RBC 5.18* 4.99 4.46  HGB 14.9 14.8 13.1  HCT 44.8 41.7 37.3  MCV 86.5 83.6 83.6  MCH 28.8 29.7 29.4  MCHC 33.3 35.5 35.1  RDW 13.9 13.7 13.4  PLT 300 310 284   Thyroid  Recent Labs  Lab 04/25/21 1310  TSH 0.842  FREET4 1.13*    BNPNo results for input(s): BNP, PROBNP in the last 168 hours.  DDimer No results for input(s): DDIMER in the last 168 hours.   Radiology    DG ABD ACUTE 2+V W 1V CHEST  Result Date: 04/28/2021 CLINICAL DATA:  Enteritis. EXAM: DG ABDOMEN ACUTE WITH 1 VIEW CHEST COMPARISON:  04/27/2021 FINDINGS: There is no evidence of dilated bowel loops or free intraperitoneal air. Colonic air-fluid levels noted on the upright images of the abdomen. No radiopaque calculi or other significant radiographic abnormality is seen. Heart size and mediastinal contours are within normal limits. Both lungs are clear. IMPRESSION: Nonobstructive bowel gas pattern. Colonic air-fluid levels noted which may reflect acute diarrheal illness. Electronically Signed   By: Signa Kell M.D.   On: 04/28/2021 10:18   DG ABD ACUTE 2+V W 1V CHEST  Result Date: 04/27/2021 CLINICAL  DATA:  Nausea vomiting, atrial fibrillation EXAM: DG ABDOMEN ACUTE WITH 1 VIEW CHEST COMPARISON:  None. FINDINGS: Chest: The heart is enlarged. No pleural effusion. No pneumothorax. No mass or consolidation. No acute osseous abnormality. Abdomen: Normal bowel gas pattern. No abnormal calcification or unexpected radiopaque foreign body. Degenerative changes in the lower lumbar spine. IMPRESSION: Chest: Cardiomegaly. No overt edema, effusion or consolidative process in the lungs. Abdomen: No acute radiographic abnormality in the abdomen. Electronically Signed   By: Olive Bass M.D.   On: 04/27/2021 10:38   ECHOCARDIOGRAM COMPLETE  Result Date: 04/26/2021    ECHOCARDIOGRAM REPORT   Patient Name:   Brooke Miles Date of Exam: 04/26/2021 Medical Rec #:  [id number]  Height:       68.0 in Accession #:    [id number] Weight:       220.0 lb Date of Birth:  January 21, 1956  BSA:          2.128 m Patient Age:    65 years   BP:           121/87 mmHg Patient Gender: F          HR:           114 bpm. Exam Location:  [facility] Procedure: 2D Echo Indications:     Atrial Fibrillation I48.91  History:         Patient has no prior history of Echocardiogram examinations.  Sonographer:     Overton Mam RDCS Referring Phys:  [id number] Floreen Comber PATEL Diagnosing Phys: Yvonne Kendall MD IMPRESSIONS  1. Left ventricular ejection fraction, by estimation, is 30 to 35%. The left ventricle has moderately decreased function. The left ventricle demonstrates global hypokinesis. The left ventricular internal cavity size was borderline dilated. There is mild  left ventricular hypertrophy. Left ventricular diastolic parameters are indeterminate.  2. Right ventricular systolic function is low normal. The right ventricular size is normal. There is normal pulmonary artery systolic pressure.  3. Left atrial size was severely dilated.  4. The mitral valve is degenerative. Moderate to severe mitral valve regurgitation. No evidence of mitral stenosis.  5. The  aortic  valve is tricuspid. There is mild thickening of the aortic valve. Aortic valve regurgitation is not visualized. Mild aortic valve sclerosis is present, with no evidence of aortic valve stenosis.  6. The inferior vena cava is normal in size with greater than 50% respiratory variability, suggesting right atrial pressure of 3 mmHg. FINDINGS  Left Ventricle: Left ventricular ejection fraction, by estimation, is 30 to 35%. The left ventricle has moderately decreased function. The left ventricle demonstrates global hypokinesis. The left ventricular internal cavity size was borderline dilated. There is mild left ventricular hypertrophy. Left ventricular diastolic parameters are indeterminate. Right Ventricle: The right ventricular size is normal. No increase in right ventricular wall thickness. Right ventricular systolic function is low normal. There is normal pulmonary artery systolic pressure. The tricuspid regurgitant velocity is 2.47 m/s,  and with an assumed right atrial pressure of 3 mmHg, the estimated right ventricular systolic pressure is 27.4 mmHg. Left Atrium: Left atrial size was severely dilated. Right Atrium: Right atrial size was normal in size. Pericardium: There is no evidence of pericardial effusion. Mitral Valve: The mitral valve is degenerative in appearance. There is mild thickening of the mitral valve leaflet(s). Moderate to severe mitral valve regurgitation. No evidence of mitral valve stenosis. Tricuspid Valve: The tricuspid valve is normal in structure. Tricuspid valve regurgitation is mild. Aortic Valve: The aortic valve is tricuspid. There is mild thickening of the aortic valve. Aortic valve regurgitation is not visualized. Mild aortic valve sclerosis is present, with no evidence of aortic valve stenosis. Aortic valve peak gradient measures 8.2 mmHg. Pulmonic Valve: The pulmonic valve was grossly normal. Pulmonic valve regurgitation is not visualized. No evidence of pulmonic stenosis.  Aorta: The aortic root and ascending aorta are structurally normal, with no evidence of dilitation. Pulmonary Artery: The pulmonary artery is of normal size. Venous: The inferior vena cava was not well visualized. The inferior vena cava is normal in size with greater than 50% respiratory variability, suggesting right atrial pressure of 3 mmHg. IAS/Shunts: The interatrial septum was not well visualized.  LEFT VENTRICLE PLAX 2D LVIDd:         5.10 cm      Diastology LVIDs:         4.10 cm      LV e' medial:    5.00 cm/s LV PW:         1.30 cm      LV E/e' medial:  20.2 LV IVS:        1.20 cm      LV e' lateral:   10.80 cm/s LVOT diam:     2.10 cm      LV E/e' lateral: 9.4 LV SV:         45 LV SV Index:   21 LVOT Area:     3.46 cm  LV Volumes (MOD) LV vol d, MOD A2C: 98.7 ml LV vol d, MOD A4C: 101.0 ml LV vol s, MOD A2C: 60.6 ml LV vol s, MOD A4C: 58.6 ml LV SV MOD A2C:     38.1 ml LV SV MOD A4C:     101.0 ml LV SV MOD BP:      40.3 ml RIGHT VENTRICLE RV Basal diam:  3.00 cm RV S prime:     13.80 cm/s TAPSE (M-mode): 1.9 cm LEFT ATRIUM              Index       RIGHT ATRIUM  Index LA diam:        4.70 cm  2.21 cm/m  RA Area:     15.50 cm LA Vol (A2C):   110.0 ml 51.69 ml/m RA Volume:   40.50 ml  19.03 ml/m LA Vol (A4C):   114.0 ml 53.57 ml/m LA Biplane Vol: 113.0 ml 53.10 ml/m  AORTIC VALVE                PULMONIC VALVE AV Area (Vmax): 2.02 cm    PV Vmax:       0.96 m/s AV Vmax:        143.00 cm/s PV Peak grad:  3.7 mmHg AV Peak Grad:   8.2 mmHg LVOT Vmax:      83.60 cm/s LVOT Vmean:     60.000 cm/s LVOT VTI:       0.131 m  AORTA Ao Root diam: 2.60 cm Ao Asc diam:  2.90 cm MITRAL VALVE                TRICUSPID VALVE MV Area (PHT): 2.53 cm     TV Peak grad:   21.3 mmHg MV Decel Time: 300 msec     TV Vmax:        2.31 m/s MV E velocity: 101.00 cm/s  TR Peak grad:   24.4 mmHg                             TR Vmax:        247.00 cm/s                              SHUNTS                             Systemic  VTI:  0.13 m                             Systemic Diam: 2.10 cm Yvonne Kendall MD Electronically signed by Yvonne Kendall MD Signature Date/Time: 04/26/2021/3:21:26 PM    Final     Cardiac Studies   Echocardiogram was done on October 1:   1. Left ventricular ejection fraction, by estimation, is 30 to 35%. The  left ventricle has moderately decreased function. The left ventricle  demonstrates global hypokinesis. The left ventricular internal cavity size  was borderline dilated. There is mild   left ventricular hypertrophy. Left ventricular diastolic parameters are  indeterminate.   2. Right ventricular systolic function is low normal. The right  ventricular size is normal. There is normal pulmonary artery systolic  pressure.   3. Left atrial size was severely dilated.   4. The mitral valve is degenerative. Moderate to severe mitral valve  regurgitation. No evidence of mitral stenosis.   5. The aortic valve is tricuspid. There is mild thickening of the aortic  valve. Aortic valve regurgitation is not visualized. Mild aortic valve  sclerosis is present, with no evidence of aortic valve stenosis.   6. The inferior vena cava is normal in size with greater than 50%  respiratory variability, suggesting right atrial pressure of 3 mmHg.   Patient Profile     65 y.o. female with history of essential hypertension, diabetes mellitus and obesity who presented with abdominal pain thought to be due to ischemic colitis with lesions on the kidneys  suspicious for embolic events.   Assessment & Plan    1.  Persistent atrial fibrillation: Onset is not known but this could be chronic.  Ventricular rate is now reasonably controlled with carvedilol and digoxin.  Renal function is normal.  Given suspected embolic events and high CHA2DS2-VASc score, long-term anticoagulation is recommended with either Eliquis 5 mg twice daily or Xarelto 20 mg daily.  This will be decided based on availability as she does not  seem to have prescription coverage.  Echocardiogram showed severely dilated left atrium and thus I think it will be difficult to get her back in sinus rhythm.  We will have to continue with rate control.  2.  Systolic heart failure with an EF of 30 to 35%: Suspect that this is likely chronic could be related to tachycardia mediated cardiomyopathy.  However, given her risk factors, ischemic heart disease cannot be excluded. Recommend medical therapy for now.  She is already on carvedilol and low-dose digoxin.  I added spironolactone and small dose losartan.  The patient does not appear to be volume overloaded. The patient will require a right and left cardiac catheterization at some point in the near future and that can be done in the outpatient setting.  3.  Mitral regurgitation: Suspect that this is likely moderate and likely due to severe left atrial enlargement and dilated mitral valve annulus.  This can be evaluated with repeat echocardiogram after optimizing medical therapy for cardiomyopathy.  Discussed plans with Dr. Maryfrances Bunnell.     For questions or updates, please contact [facility] Please consult [email or web address] for contact info under        Signed, Lorine Bears, MD  04/28/2021, 10:23 AM

## 2021-04-28 NOTE — Progress Notes (Signed)
Progress Note    Brooke Miles   SAY:[id number]  DOB: 01-Sep-1955  DOA: 04/25/2021     3 Date of Service: 04/28/2021      Brief summary: Brooke Miles is a 65 y.o. F with DM, HTN who presented with 1 day N/V.  Patient speaks Wolof and also tends to minimize her symptoms, which both limit history taking.  Evidently, she lives in [place], was here for about a year in 2020 during the Pandemic, and then came back about a month ago, in part for medical care.  Per daughter-in-law, family have noticed she is dyspneic with exertion for about a year or two.  She also does acknowledge palpitations or tachycardia with exertion during this interval.  There is also a family history (in a brother) of Afib.  Despite this chronic DOE, the patient was in her normal health until the day before admission, when she finished breakfast, and then developed severe abdominal pain suddenly, associated with nausea.  THis was intense, so she came to the ER.  In the ER, noted to be in Afib with RVR.  Mag and K low.  WBC 16K.  Troponin low and flat.  Lactate elevated.  CTA chest no PE.  Coronary artery calcifications noted.  Some lung base disease noted.  CT abdomen showed multiple thickened loops of bowel.  Multiple hypoenhancing defects of the inferior pole of the right kidney are seen suggesting either pyelonephritis or infarction.      9/30: Admitted and started on antibiotics, heparin gtt, Gen Surg consulted 10/1: Serial abdominal exams much better, x-ray abdomen normal --> echo showed EF 30-35%, mod-severe MR 10/2: Abdominal pain still present slightly, appetite poor, but lactic acid cleared; cardiology consulted 10/3: Advanced to solid food but developed abdominal pain and vomiting --> back to clears               Subjective:  No abdominal pain at all last night or this morning.  Appetite has returned.  No palpitations, orthopnea, swelling, dyspnea, confusion, fever.  No hematochezia.  Hospital  Problems * Ischemic enteritis (HCC) Seemed to be completely without pain last night and this morning, then advanced to solid diet and developed worsening abdominal pain and vomiting.  Discharge canceled.  - Back to clears only - Continue Eliquis - Continue IV antibiotics   Chronic systolic CHF (congestive heart failure) (HCC) EF found this hospitalization to be 30 to 35% with moderate to severe mitral regurgitation.   Appears euvolemic - Continue carvedilol, losartan, spironolactone - Outpatietn cardiology follow up  Atrial fibrillation with rapid ventricular response (HCC) CHA2DS2-Vasc 4 for   gender, hypertension, CHF, and diabetes.   HR improved -Continue Eliquis -Continue digoxin, Coreg     Hypomagnesemia Magnesium normal  Hypokalemia Keep >4 - Continue potassium supplementation  Hypertension associated with diabetes (HCC) BP normal - Stop home amlodipine, chlorthalidone - Continue Coreg, losartan, spironolactone   Demand ischemia (HCC) Troponin minimally elevated at 59, likely demand ischemia in setting of A. fib with RVR and sepsis.    Type 2 diabetes mellitus (HCC) Hyperglycemic on admission without evidence of DKA/HHS. Glucoses normal - Hold metformin - Continue SSI  Hyponatremia Mild, asymptomatic     Objective Vital signs were reviewed and unremarkable except for: Elevated blood pressure, heart rate 90. BP (!) 134/96 (BP Location: Left Arm)   Pulse 85   Temp 98.1 F (36.7 C) (Oral)   Resp 20   Ht 5\' 8"  (1.727 m)   Wt 99.8 kg  SpO2 99%   BMI 33.45 kg/m      Exam General appearance: Adult female, lying in bed, no acute distress     HEENT: Anicteric, conjunctive are pink, lids and lashes normal.  No nasal deformity, discharge, or epistaxis. Skin:  Cardiac: Tachycardic, irregular, mild tachycardia.  No murmurs, no lower extremity edema, JVP not visible. Respiratory: Normal respiratory rhythm, lungs clear without rales or wheezes. Abdomen:  Abdomen soft no tenderness palpation or guarding, no ascites or distention, no rigidity or rebound.  Addendum later in the day, the patient has marked right-sided tenderness to palpation, no rebound, no rigidity still.  Abdomen still soft. MSK:  Neuro: Awake and alert, extraocular movements intact, moves all extremities with generalized weakness but symmetric strength, speech fluent. Psych: Attention normal, affect normal, judgment insight appear normal.  All history collected via [facility] TELEPHONIC interpreter.    Labs / Other Information My review of labs, imaging, notes and other tests is significant for Magnesium up to 1.9, potassium still 3.2.  Sodium 134.  Abdominal x-ray, normal gas pattern.  No free air.     Time spent: 35 minutes [facility] 04/28/2021, 8:06 PM

## 2021-04-28 NOTE — Assessment & Plan Note (Signed)
Troponin minimally elevated at 59, likely demand ischemia in setting of A. fib with RVR and sepsis.   

## 2021-04-28 NOTE — Assessment & Plan Note (Signed)
Magnesium normal

## 2021-04-28 NOTE — Progress Notes (Signed)
Inpatient Diabetes Program Recommendations  AACE/ADA: New Consensus Statement on Inpatient Glycemic Control (2015)  Target Ranges:  Prepandial:   less than 140 mg/dL      Peak postprandial:   less than 180 mg/dL (1-2 hours)      Critically ill patients:  140 - 180 mg/dL  Results for KEDRA, MCGLADE (MRN [id number]) as of 04/28/2021 10:02  Ref. Range 04/27/2021 00:04 04/27/2021 04:07 04/27/2021 07:35 04/27/2021 11:37 04/27/2021 16:06 04/27/2021 21:22  Glucose-Capillary Latest Ref Range: 70 - 99 mg/dL 801 (H) 655 (H)  2 units Novolog  131 (H)  2 units Novolog @1003  256 (H)  8 units Novolog @1319  342 (H)  11 units Novolog  124 (H)  2 units Novolog      Admit with: Ischemic enteritis  History: DM, CHF  Home DM Meds: Metformin XR 500 mg BID  Current Orders: Novolog Moderate Correction Scale/ SSI (0-15 units) Q4 hours    MD- Note patient advanced to [facility] diet today.  Afternoon CBGs elevated yesterday after consuming 90% of her CL diet.  Please consider:  1. Change Novolog SSI to tid ac + hs now that pt allowed to eat/drink  2. Start low dose Novolog Meal Coverage: Novolog 3 units TID with meals Hold if pt eats <50% of meal, Hold if pt NPO    --Will follow patient during hospitalization--  RN, MSN, CDE Diabetes Coordinator Inpatient Glycemic Control Team Team Pager: [phone] (8a-5p)

## 2021-04-28 NOTE — TOC CM/SW Note (Addendum)
Left voicemail at [facility] to see if they had Eliquis 5 mg and Xarelto 20 mg.  Charlynn Court, CSW [phone]  4:50 pm: Per RN, family member went to [facility] to pick up prescriptions.  Charlynn Court, CSW [phone]

## 2021-04-28 NOTE — Assessment & Plan Note (Signed)
Mild, asymptomatic, with leukopenia. 

## 2021-04-28 NOTE — Assessment & Plan Note (Signed)
Hyperglycemic on admission without evidence of DKA/HHS. Glucoses normal - Hold metformin - Continue SSI

## 2021-04-28 NOTE — Progress Notes (Signed)
ANTICOAGULATION CONSULT NOTE - Initial Consult  Pharmacy Consult for Heparin Drip Indication: New onset Atrial Fibrillation   Allergies  Allergen Reactions   Quinolones Hives and Itching    Patient Measurements: Height: 5\' 8"  (172.7 cm) Weight: 99.8 kg (220 lb) IBW/kg (Calculated) : 63.9 Heparin Dosing Weight: 85.8 kg  Vital Signs: Temp: 98.3 F (36.8 C) (10/03 0401) BP: 127/87 (10/03 0401) Pulse Rate: 90 (10/03 0401)  Labs: Recent Labs     0000 04/25/21 1310 04/25/21 1830 04/25/21 2154 04/26/21 0203 04/26/21 0451 04/26/21 0754 04/27/21 0826 04/27/21 1005 04/27/21 1701 04/27/21 2158 04/28/21 0504  HGB  --  16.9*  --   --   --  14.9  --   --  14.8  --   --  13.1  HCT  --  47.4*  --   --   --  44.8  --   --  41.7  --   --  37.3  PLT  --  402*  --   --   --  300  --   --  310  --   --  284  APTT   < >  --  27  --   --   --   --   --  29 86* 83* 90*  LABPROT  --   --  15.0  --   --   --   --   --  15.8*  --   --   --   INR  --   --  1.2  --   --   --   --   --  1.3*  --   --   --   HEPARINUNFRC  --   --   --   --    < >  --  0.41  --  >1.10*  --   --  0.81*  CREATININE  --  1.03*  --   --   --  0.83  --  0.77  --   --   --  0.78  TROPONINIHS  --  59*  --  63*  --   --   --   --   --   --   --   --    < > = values in this interval not displayed.     Estimated Creatinine Clearance: 87.8 mL/min (by C-G formula based on SCr of 0.78 mg/dL).   Medical History: Past Medical History:  Diagnosis Date   Diabetes mellitus without complication (HCC)    Hyperlipidemia    Hypertension     Assessment: Patient is a 65yo female admitted with sepsis secondary to enteritis. Patient found to have new onset Atrial fibrillation with RVR. Pharmacy originally consulted for heparin infusion on 9/30, then patient was switched to Apixaban. Pharmacy now  consulted for for heparin restart. Last Apixaban dose 10/1 @ 2018.   Date Time aPTT/HL Rate/Comment 10/2 1701 86s / --  Therapeutic  x1; CTM aPTT/HL for correlation 10/3     0504   90s / 0.81              Baseline Labs: aPTT - 29s INR - 1.2 Hgb - 14.9>14.8 Plts - 300>310  Goal of Therapy:  aPTT: 0.66-1.02 Heparin level 0.3-0.7 units/ml Monitor platelets by anticoagulation protocol: Yes   Plan:  10/3 @ 0504:   HL = 0.81 ,   aPTT = 90 aPTT is therapeutic but HL still elevated.  Will continue pt on current  rate and recheck HL and aPTT on 10/4 with AM labs.   Scherrie Gerlach, PharmD Clinical Pharmacist 04/28/2021 5:45 AM

## 2021-04-28 NOTE — Assessment & Plan Note (Signed)
Keep >4 - Continue potassium supplementation

## 2021-04-29 ENCOUNTER — Other Ambulatory Visit: Payer: Self-pay

## 2021-04-29 DIAGNOSIS — I5022 Chronic systolic (congestive) heart failure: Secondary | ICD-10-CM

## 2021-04-29 LAB — BASIC METABOLIC PANEL
Anion gap: 7 (ref 5–15)
BUN: 11 mg/dL (ref 8–23)
CO2: 27 mmol/L (ref 22–32)
Calcium: 8.8 mg/dL — ABNORMAL LOW (ref 8.9–10.3)
Chloride: 102 mmol/L (ref 98–111)
Creatinine, Ser: 0.88 mg/dL (ref 0.44–1.00)
GFR, Estimated: >60 mL/min (ref >60)
Glucose, Bld: 112 mg/dL — ABNORMAL HIGH (ref 70–99)
Potassium: 3.8 mmol/L (ref 3.5–5.1)
Sodium: 136 mmol/L (ref 135–145)

## 2021-04-29 LAB — GLUCOSE, CAPILLARY
Glucose-Capillary: 111 mg/dL — ABNORMAL HIGH (ref 70–99)
Glucose-Capillary: 107 mg/dL — ABNORMAL HIGH (ref 70–99)
Glucose-Capillary: 212 mg/dL — ABNORMAL HIGH (ref 70–99)
Glucose-Capillary: 187 mg/dL — ABNORMAL HIGH (ref 70–99)

## 2021-04-29 LAB — CBC
HCT: 39.7 % (ref 36.0–46.0)
Hemoglobin: 13.8 g/dL (ref 12.0–15.0)
MCH: 29.3 pg (ref 26.0–34.0)
MCHC: 34.8 g/dL (ref 30.0–36.0)
MCV: 84.3 fL (ref 80.0–100.0)
Platelets: 303 10*3/uL (ref 150–400)
RBC: 4.71 MIL/uL (ref 3.87–5.11)
RDW: 13.2 % (ref 11.5–15.5)
WBC: 8.9 10*3/uL (ref 4.0–10.5)
nRBC: 0 % (ref 0.0–0.2)

## 2021-04-29 LAB — LACTIC ACID, PLASMA: Lactic Acid, Venous: 1.3 mmol/L (ref 0.5–1.9)

## 2021-04-29 NOTE — Progress Notes (Signed)
Mobility Specialist - Progress Note   04/29/21 1500  Mobility  Activity Ambulated in hall  Level of Assistance Independent  Assistive Device None  Distance Ambulated (ft) 180 ft  Mobility Ambulated independently in hallway  Mobility Response Tolerated well  Mobility performed by Mobility specialist  $Mobility charge 1 Mobility    Pre-mobility: 92 HR, 99% SpO2 During mobility: 94 HR, 98% SpO2  Pt ambulated in hallway independently. No complaints. HR ranging 80-95 bpm with ambulation.    Brooke Miles Mobility Specialist 04/29/21, 3:31 PM

## 2021-04-29 NOTE — Assessment & Plan Note (Addendum)
Kidney findings in addition to small bowel findings on CT imaging poses question of thromboembolic showering.  Renal function normal.

## 2021-04-29 NOTE — Assessment & Plan Note (Signed)
New diagnosis.  CHA2DS2-Vasc 4 for   gender, hypertension, CHF, and diabetes.   Echo showed reduced EF, moderate MR.  Cardiology consulted, recommended BB, dig and NOAC.

## 2021-04-29 NOTE — Assessment & Plan Note (Signed)
Resolved.  From ischemia.

## 2021-04-29 NOTE — Discharge Summary (Signed)
Physician Discharge Summary   Patient name: Brooke Miles  Admit date:     04/25/2021  Discharge date: 04/29/2021  Attending Physician: Charlsie Quest [[id number]]  Discharge Physician: Alberteen Sam   PCP: Pcp, No     Recommendations at discharge:  Follow up with Cardiology as directed Please note: patient intends to move back to [place] in 1-2 months, they will investigate availability of Eliquis there, but follow up needed       Follow-up Information     Debbe Odea, MD. Go on 05/15/2021.   Specialties: Cardiology, Radiology Why: Go at 11:40am. Contact information: [address] [place] [address] [phone]         [facility]. Schedule an appointment as soon as possible for a visit in 1 week(s).   Specialty: Primary Care Why: Call to make your follow up. Contact information: 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 F with DM, HTN who presented with acute onset abdominal pain and N/V.  Evidently, family have noticed the patient is dyspneic with exertion for about a year or two.  She also does acknowledge palpitations or tachycardia with exertion during this interval.  There is also a family history (in a brother) of Afib.  Despite this chronic DOE, the patient was in her normal health until the day before admission, when she finished breakfast, and then developed  severe abdominal pain suddenly, associated with nausea.  This was intense, so she came to the ER.  In the ER, noted to be in Afib with RVR.  Mag and K low.  WBC 16K.  Troponin low and flat.  Lactate elevated.  CTA chest no PE.  Coronary artery calcifications noted.  Some lung base disease noted.  CT abdomen showed multiple thickened loops of bowel.  Multiple hypoenhancing defects of the inferior pole of the right kidney are seen suggesting either pyelonephritis or infarction.      9/30: Admitted and started on antibiotics, heparin gtt, Gen Surg consulted 10/1: Serial abdominal exams much better, x-ray abdomen normal --> echo showed EF 30-35%, mod-severe MR 10/2: Abdominal pain still present slightly, appetite poor, but lactic acid cleared; cardiology consulted 10/3: Advanced to solid food but developed abdominal pain and vomiting --> back to clears 10/4: Pain completely resolved again.  Tolerated clear liquid diet, then soft diet, and had no return of pain or nausea, felt at baseline    UTI ruled out Sepsis ruled out            * Ischemic enteritis (HCC) Likely ischemic enteritis.  Treated with antibiotics and fluids and anticoagulation.    Serial abdominal exams benign throughout stay. Symptoms improving mostly day by day, able to tolerate an oral diet on the day of discharge without pain or nausea at all.  Return precautions give.  Chronic systolic CHF (congestive heart failure) (HCC) EF found this hospitalization to be 30 to 35% with moderate to severe mitral regurgitation.    Appears euvolemic, asymptomatic.  Started on BB, losartan and spironolactone   Atrial fibrillation with rapid ventricular response (HCC) New diagnosis.  CHA2DS2-Vasc 4 for   gender, hypertension, CHF, and diabetes.   Echo showed reduced EF, moderate MR.  Cardiology consulted, recommended BB, dig and NOAC.       Elevated lactic acid level  Resolved.  From ischemia.  Hypomagnesemia Treated  and resolved  Hypokalemia    Hypertension associated with diabetes (HCC) Home amlodipine, chlorthalidone stopped.  Started on Coreg, losartan, spironolactone.  Demand ischemia (HCC)    Type 2 diabetes mellitus (HCC)    Hyponatremia    Abnormal CT scan, kidney Kidney findings in addition to small bowel findings on CT imaging poses question of thromboembolic showering.  Renal function normal.         Condition at discharge: good  Exam Physical Exam Constitutional:      Appearance: She is well-developed. She is not ill-appearing or toxic-appearing.  Cardiovascular:     Rate and Rhythm: Normal rate. Rhythm irregular.     Heart sounds: No murmur heard.   No gallop.  Pulmonary:     Effort: Pulmonary effort is normal.     Breath sounds: Normal breath sounds. No wheezing or rales.  Abdominal:     General: Abdomen is flat. Bowel sounds are normal. There is no distension.     Tenderness: There is no abdominal tenderness. There is no guarding or rebound.  Skin:    General: Skin is warm and dry.     Coloration: Skin is not cyanotic.  Neurological:     General: No focal deficit present.     Mental Status: She is alert and oriented to person, place, and time.     Motor: No weakness.  Psychiatric:        Mood and Affect: Mood normal.        Behavior: Behavior normal.      Disposition: Home  Discharge time: less than 30 minutes. Allergies as of 04/29/2021       Reactions   Quinolones Hives, Itching        Medication List     STOP taking these medications    amLODipine 5 MG tablet Commonly known as: NORVASC   chlorthalidone 25 MG tablet Commonly known as: HYGROTON   lisinopril 40 MG tablet Commonly known as: ZESTRIL       TAKE these medications    carvedilol 25 MG tablet Commonly known as: COREG Take 1 tablet (25 mg total) by mouth 2 (two) times daily with a meal.   diclofenac Sodium 1 % Gel Commonly known as: VOLTAREN Apply 4 g topically 4  (four) times daily as needed for pain.   digoxin 0.125 MG tablet Commonly known as: LANOXIN Take 1 tablet (0.125 mg total) by mouth once daily.   Eliquis 5 MG Tabs tablet Generic drug: apixaban Take 1 tablet (5 mg total) by mouth 2 (two) times daily.   losartan 25 MG tablet Commonly known as: COZAAR Take 1 tablet (25 mg total) by mouth once daily.   metFORMIN 500 MG 24 hr tablet Commonly known as: GLUCOPHAGE-XR Take 500 mg by mouth 2 (two) times daily.   spironolactone 25 MG tablet Commonly known as: ALDACTONE Take 1 tablet (25 mg total) by mouth once daily.        CT Angio  Chest PE W and/or Wo Contrast  Result Date: 04/25/2021 CLINICAL DATA:  PE suspected EXAM: CT ANGIOGRAPHY CHEST WITH CONTRAST TECHNIQUE: Multidetector CT imaging of the chest was performed using the standard protocol during bolus administration of intravenous contrast. Multiplanar CT image reconstructions and MIPs were obtained to evaluate the vascular anatomy. CONTRAST:  47mL OMNIPAQUE IOHEXOL 350 MG/ML SOLN COMPARISON:  None. FINDINGS: Cardiovascular: Satisfactory opacification of the pulmonary arteries to the segmental level. No evidence of pulmonary embolism. Cardiomegaly. Left coronary artery calcifications. No pericardial effusion. Mediastinum/Nodes: No enlarged mediastinal, hilar, or axillary lymph nodes. Thyroid gland, trachea, and esophagus demonstrate no significant findings. Lungs/Pleura: Nonspecific infectious or inflammatory ground-glass airspace disease of the right lung base (series 7, image 57). No pleural effusion or pneumothorax. Upper Abdomen: Please see separately reported examination of the abdomen and pelvis. Musculoskeletal: No chest wall abnormality. No acute or significant osseous findings. Review of the MIP images confirms the above findings. IMPRESSION: 1. Negative examination for pulmonary embolism. 2. Nonspecific infectious or inflammatory ground-glass airspace disease of the right lung  base. 3. Cardiomegaly and coronary artery disease. Electronically Signed   By: Jearld Lesch M.D.   On: 04/25/2021 17:04   CT ABDOMEN PELVIS W CONTRAST  Result Date: 04/25/2021 CLINICAL DATA:  Abdominal distension, upper abdominal pain, nausea, vomiting EXAM: CT ABDOMEN AND PELVIS WITH CONTRAST TECHNIQUE: Multidetector CT imaging of the abdomen and pelvis was performed using the standard protocol following bolus administration of intravenous contrast. CONTRAST:  22mL OMNIPAQUE IOHEXOL 350 MG/ML SOLN COMPARISON:  None. FINDINGS: Lower chest: No acute abnormality. Hepatobiliary: No solid liver abnormality is seen. No gallstones, gallbladder wall thickening, or biliary dilatation. Pancreas: Unremarkable. No pancreatic ductal dilatation or surrounding inflammatory changes. Spleen: Normal in size without significant abnormality. Adrenals/Urinary Tract: Adrenal glands are unremarkable. There are multiple hypoenhancing defects of the inferior pole of the right kidney (series 6, image 53). The left kidney is normal, without renal calculi, solid lesion, or hydronephrosis. Bladder is unremarkable. Stomach/Bowel: Stomach is within normal limits. Appendix appears normal. There are multiple thickened loops of mid to distal small bowel in the low abdomen and pelvis (series 3, image 77). Notably, the terminal ileum is spared. Vascular/Lymphatic: Aortic atherosclerosis. No enlarged abdominal or pelvic lymph nodes. Reproductive: No mass or other significant abnormality. Other: No abdominal wall hernia or abnormality. No abdominopelvic ascites. Musculoskeletal: No acute or significant osseous findings. IMPRESSION: 1. There are multiple thickened loops of mid to distal small bowel in the low abdomen and pelvis. Notably, the terminal ileum is spared. Findings are consistent with nonspecific infectious, inflammatory, or ischemic enteritis. 2. There are multiple hypoenhancing defects of the inferior pole of the right kidney, this  appearance suggesting either pyelonephritis or perhaps infarction. Correlate with urinalysis. 3. Thromboembolic shower is a general differential consideration that could produce right kidney findings and small-bowel findings, and central source could be further investigated by echocardiography if desired. Aortic Atherosclerosis (ICD10-I70.0). Electronically Signed   By: Jearld Lesch M.D.   On: 04/25/2021 17:10   DG ABD ACUTE 2+V W 1V CHEST  Result Date: 04/28/2021 CLINICAL DATA:  Enteritis. EXAM: DG ABDOMEN ACUTE WITH 1 VIEW CHEST COMPARISON:  04/27/2021 FINDINGS: There is no evidence of dilated bowel loops or free intraperitoneal air. Colonic air-fluid levels noted on the upright images of the abdomen. No radiopaque calculi or other significant radiographic abnormality is seen. Heart size and mediastinal contours are within normal limits. Both lungs are clear. IMPRESSION: Nonobstructive bowel gas pattern. Colonic air-fluid levels noted which may  reflect acute diarrheal illness. Electronically Signed   By: Signa Kell M.D.   On: 04/28/2021 10:18   DG ABD ACUTE 2+V W 1V CHEST  Result Date: 04/27/2021 CLINICAL DATA:  Nausea vomiting, atrial fibrillation EXAM: DG ABDOMEN ACUTE WITH 1 VIEW CHEST COMPARISON:  None. FINDINGS: Chest: The heart is enlarged. No pleural effusion. No pneumothorax. No mass or consolidation. No acute osseous abnormality. Abdomen: Normal bowel gas pattern. No abnormal calcification or unexpected radiopaque foreign body. Degenerative changes in the lower lumbar spine. IMPRESSION: Chest: Cardiomegaly. No overt edema, effusion or consolidative process in the lungs. Abdomen: No acute radiographic abnormality in the abdomen. Electronically Signed   By: Olive Bass M.D.   On: 04/27/2021 10:38   DG ABD ACUTE 2+V W 1V CHEST  Result Date: 04/26/2021 CLINICAL DATA:  Enteritis. Sepsis. Abdominal pain with nausea and vomiting. EXAM: DG ABDOMEN ACUTE WITH 1 VIEW CHEST COMPARISON:  None.  FINDINGS: Cardiac size is not well evaluated on a portable film. Cardiomegaly not excluded. The hila and mediastinum are normal. No pneumothorax. The lungs are clear. No free air, portal venous gas, or pneumatosis. Air seen in nondilated colon. No evidence of obstruction. There is a paucity of small bowel gas. IMPRESSION: No acute abnormalities identified. No evidence of obstruction. No cause for the patient's symptoms noted pan Electronically Signed   By: Gerome Sam III M.D.   On: 04/26/2021 07:29   ECHOCARDIOGRAM COMPLETE  Result Date: 04/26/2021    ECHOCARDIOGRAM REPORT   Patient Name:   Brooke Miles Date of Exam: 04/26/2021 Medical Rec #:  [id number]  Height:       68.0 in Accession #:    [id number] Weight:       220.0 lb Date of Birth:  06-06-1956  BSA:          2.128 m Patient Age:    65 years   BP:           121/87 mmHg Patient Gender: F          HR:           114 bpm. Exam Location:  [facility] Procedure: 2D Echo Indications:     Atrial Fibrillation I48.91  History:         Patient has no prior history of Echocardiogram examinations.  Sonographer:     Overton Mam RDCS Referring Phys:  [id number] Floreen Comber PATEL Diagnosing Phys: Yvonne Kendall MD IMPRESSIONS  1. Left ventricular ejection fraction, by estimation, is 30 to 35%. The left ventricle has moderately decreased function. The left ventricle demonstrates global hypokinesis. The left ventricular internal cavity size was borderline dilated. There is mild  left ventricular hypertrophy. Left ventricular diastolic parameters are indeterminate.  2. Right ventricular systolic function is low normal. The right ventricular size is normal. There is normal pulmonary artery systolic pressure.  3. Left atrial size was severely dilated.  4. The mitral valve is degenerative. Moderate to severe mitral valve regurgitation. No evidence of mitral stenosis.  5. The aortic valve is tricuspid. There is mild thickening of the aortic valve. Aortic valve regurgitation is not  visualized. Mild aortic valve sclerosis is present, with no evidence of aortic valve stenosis.  6. The inferior vena cava is normal in size with greater than 50% respiratory variability, suggesting right atrial pressure of 3 mmHg. FINDINGS  Left Ventricle: Left ventricular ejection fraction, by estimation, is 30 to 35%. The left ventricle has moderately decreased function. The left ventricle demonstrates global hypokinesis. The left ventricular  internal cavity size was borderline dilated. There is mild left ventricular hypertrophy. Left ventricular diastolic parameters are indeterminate. Right Ventricle: The right ventricular size is normal. No increase in right ventricular wall thickness. Right ventricular systolic function is low normal. There is normal pulmonary artery systolic pressure. The tricuspid regurgitant velocity is 2.47 m/s,  and with an assumed right atrial pressure of 3 mmHg, the estimated right ventricular systolic pressure is 27.4 mmHg. Left Atrium: Left atrial size was severely dilated. Right Atrium: Right atrial size was normal in size. Pericardium: There is no evidence of pericardial effusion. Mitral Valve: The mitral valve is degenerative in appearance. There is mild thickening of the mitral valve leaflet(s). Moderate to severe mitral valve regurgitation. No evidence of mitral valve stenosis. Tricuspid Valve: The tricuspid valve is normal in structure. Tricuspid valve regurgitation is mild. Aortic Valve: The aortic valve is tricuspid. There is mild thickening of the aortic valve. Aortic valve regurgitation is not visualized. Mild aortic valve sclerosis is present, with no evidence of aortic valve stenosis. Aortic valve peak gradient measures 8.2 mmHg. Pulmonic Valve: The pulmonic valve was grossly normal. Pulmonic valve regurgitation is not visualized. No evidence of pulmonic stenosis. Aorta: The aortic root and ascending aorta are structurally normal, with no evidence of dilitation. Pulmonary  Artery: The pulmonary artery is of normal size. Venous: The inferior vena cava was not well visualized. The inferior vena cava is normal in size with greater than 50% respiratory variability, suggesting right atrial pressure of 3 mmHg. IAS/Shunts: The interatrial septum was not well visualized.  LEFT VENTRICLE PLAX 2D LVIDd:         5.10 cm      Diastology LVIDs:         4.10 cm      LV e' medial:    5.00 cm/s LV PW:         1.30 cm      LV E/e' medial:  20.2 LV IVS:        1.20 cm      LV e' lateral:   10.80 cm/s LVOT diam:     2.10 cm      LV E/e' lateral: 9.4 LV SV:         45 LV SV Index:   21 LVOT Area:     3.46 cm  LV Volumes (MOD) LV vol d, MOD A2C: 98.7 ml LV vol d, MOD A4C: 101.0 ml LV vol s, MOD A2C: 60.6 ml LV vol s, MOD A4C: 58.6 ml LV SV MOD A2C:     38.1 ml LV SV MOD A4C:     101.0 ml LV SV MOD BP:      40.3 ml RIGHT VENTRICLE RV Basal diam:  3.00 cm RV S prime:     13.80 cm/s TAPSE (M-mode): 1.9 cm LEFT ATRIUM              Index       RIGHT ATRIUM           Index LA diam:        4.70 cm  2.21 cm/m  RA Area:     15.50 cm LA Vol (A2C):   110.0 ml 51.69 ml/m RA Volume:   40.50 ml  19.03 ml/m LA Vol (A4C):   114.0 ml 53.57 ml/m LA Biplane Vol: 113.0 ml 53.10 ml/m  AORTIC VALVE                PULMONIC VALVE AV Area (Vmax): 2.02 cm  PV Vmax:       0.96 m/s AV Vmax:        143.00 cm/s PV Peak grad:  3.7 mmHg AV Peak Grad:   8.2 mmHg LVOT Vmax:      83.60 cm/s LVOT Vmean:     60.000 cm/s LVOT VTI:       0.131 m  AORTA Ao Root diam: 2.60 cm Ao Asc diam:  2.90 cm MITRAL VALVE                TRICUSPID VALVE MV Area (PHT): 2.53 cm     TV Peak grad:   21.3 mmHg MV Decel Time: 300 msec     TV Vmax:        2.31 m/s MV E velocity: 101.00 cm/s  TR Peak grad:   24.4 mmHg                             TR Vmax:        247.00 cm/s                              SHUNTS                             Systemic VTI:  0.13 m                             Systemic Diam: 2.10 cm Yvonne Kendall MD Electronically signed by  Yvonne Kendall MD Signature Date/Time: 04/26/2021/3:21:26 PM    Final    Results for orders placed or performed during the hospital encounter of 04/25/21  Urine Culture     Status: Abnormal   Collection Time: 04/25/21  1:20 PM   Specimen: Urine, Clean Catch  Result Value Ref Range Status   Specimen Description   Final    URINE, CLEAN CATCH Performed at [facility], [address] [place], [place] [address]    Special Requests   Final    NONE Performed at [facility], [address] [place], [place] [address]    Culture MULTIPLE SPECIES PRESENT, SUGGEST RECOLLECTION (A)  Final   Report Status 04/27/2021 FINAL  Final  Resp Panel by RT-PCR (Flu A&B, Covid) Nasopharyngeal Swab     Status: None   Collection Time: 04/25/21  3:48 PM   Specimen: Nasopharyngeal Swab; Nasopharyngeal(NP) swabs in vial transport medium  Result Value Ref Range Status   SARS Coronavirus 2 by RT PCR NEGATIVE NEGATIVE Final    Comment: (NOTE) SARS-CoV-2 target nucleic acids are NOT DETECTED.  The SARS-CoV-2 RNA is generally detectable in upper respiratory specimens during the acute phase of infection. The lowest concentration of SARS-CoV-2 viral copies this assay can detect is 138 copies/mL. A negative result does not preclude SARS-Cov-2 infection and should not be used as the sole basis for treatment or other patient management decisions. A negative result may occur with  improper specimen collection/handling, submission of specimen other than nasopharyngeal swab, presence of viral mutation(s) within the areas targeted by this assay, and inadequate number of viral copies(<138 copies/mL). A negative result must be combined with clinical observations, patient history, and epidemiological information. The expected result is Negative.  Fact Sheet for Patients:  BloggerCourse.com  Fact Sheet for Healthcare Providers:   SeriousBroker.it  This test is  no t yet approved or cleared by the [place] and  has been authorized for detection and/or diagnosis of SARS-CoV-2 by FDA under an Emergency Use Authorization (EUA). This EUA will remain  in effect (meaning this test can be used) for the duration of the COVID-19 declaration under Section 564(b)(1) of the Act, 21 U.S.C.section 360bbb-3(b)(1), unless the authorization is terminated  or revoked sooner.       Influenza A by PCR NEGATIVE NEGATIVE Final   Influenza B by PCR NEGATIVE NEGATIVE Final    Comment: (NOTE) The Xpert Xpress SARS-CoV-2/FLU/RSV plus assay is intended as an aid in the diagnosis of influenza from Nasopharyngeal swab specimens and should not be used as a sole basis for treatment. Nasal washings and aspirates are unacceptable for Xpert Xpress SARS-CoV-2/FLU/RSV testing.  Fact Sheet for Patients: BloggerCourse.com  Fact Sheet for Healthcare Providers: SeriousBroker.it  This test is not yet approved or cleared by the [place] FDA and has been authorized for detection and/or diagnosis of SARS-CoV-2 by FDA under an Emergency Use Authorization (EUA). This EUA will remain in effect (meaning this test can be used) for the duration of the COVID-19 declaration under Section 564(b)(1) of the Act, 21 U.S.C. section 360bbb-3(b)(1), unless the authorization is terminated or revoked.  Performed at [facility], [address] [place], [place] [address]   Blood culture (routine x 2)     Status: None (Preliminary result)   Collection Time: 04/25/21  5:41 PM   Specimen: BLOOD  Result Value Ref Range Status   Specimen Description BLOOD RIGHT ANTECUBITAL  Final   Special Requests   Final    BOTTLES DRAWN AEROBIC AND ANAEROBIC Blood Culture results may not be optimal due to an inadequate volume of blood received in culture bottles   Culture   Final     NO GROWTH 4 DAYS Performed at [facility], [address], [place], [place] [address]    Report Status PENDING  Incomplete  Blood culture (routine x 2)     Status: None (Preliminary result)   Collection Time: 04/25/21  5:41 PM   Specimen: BLOOD  Result Value Ref Range Status   Specimen Description BLOOD BLOOD RIGHT WRIST  Final   Special Requests   Final    BOTTLES DRAWN AEROBIC AND ANAEROBIC Blood Culture results may not be optimal due to an inadequate volume of blood received in culture bottles   Culture   Final    NO GROWTH 4 DAYS Performed at [facility], [address], [place], [place] [address]    Report Status PENDING  Incomplete    Signed:  Alberteen Sam MD.  [facility] 04/29/2021, 4:22 PM

## 2021-04-29 NOTE — TOC CM/SW Note (Signed)
Patient has orders to discharge home today. Per daughter-in-law PCP is [facility] in [place]. She confirmed she picked up prescriptions yesterday. No further concerns. CSW signing off.  Charlynn Court, CSW [phone]

## 2021-04-29 NOTE — Assessment & Plan Note (Addendum)
Likely ischemic enteritis.  Treated with antibiotics and fluids and anticoagulation.    Serial abdominal exams benign throughout stay. Symptoms improving mostly day by day, able to tolerate an oral diet on the day of discharge without pain or nausea at all.  Return precautions give.

## 2021-04-29 NOTE — Assessment & Plan Note (Signed)
Treated and resolved °

## 2021-04-29 NOTE — Assessment & Plan Note (Signed)
Home amlodipine, chlorthalidone stopped.  Started on Coreg, losartan, spironolactone.

## 2021-04-29 NOTE — Progress Notes (Signed)
Progress Note  Patient Name: Brooke Miles Date of Encounter: 04/29/2021  [facility] Cardiologist: new   Subjective   She chest pain, dyspnea, palpitations, dizziness, presyncope, syncope, or abdominal pain. Reports she feels much better. She feels better overall and reports resolution of abdominal pain.    Inpatient Medications    Scheduled Meds:  apixaban  5 mg Oral BID   carvedilol  25 mg Oral BID WC   digoxin  0.125 mg Oral Daily   insulin aspart  0-15 Units Subcutaneous TID WC   insulin aspart  0-5 Units Subcutaneous QHS   losartan  25 mg Oral Daily   sodium chloride flush  3 mL Intravenous Q12H   spironolactone  25 mg Oral Daily   Continuous Infusions:  cefTRIAXone (ROCEPHIN)  IV 2 g (04/28/21 1321)   promethazine (PHENERGAN) injection (IM or IVPB)     PRN Meds: acetaminophen, melatonin, ondansetron **OR** ondansetron (ZOFRAN) IV, oxyCODONE, promethazine (PHENERGAN) injection (IM or IVPB)   Vital Signs    Vitals:   04/28/21 1712 04/28/21 2047 04/29/21 0353 04/29/21 0740  BP: (!) 134/96 100/75 116/88 123/80  Pulse: 85 66 81 81  Resp: 20 18 20 18   Temp: 98.1 F (36.7 C) 98.9 F (37.2 C) 98.1 F (36.7 C) 98 F (36.7 C)  TempSrc: Oral Oral Oral Oral  SpO2: 99% 99% 99% 98%  Weight:      Height:        Intake/Output Summary (Last 24 hours) at 04/29/2021 0947 Last data filed at 04/29/2021 0202 Gross per 24 hour  Intake 275.14 ml  Output --  Net 275.14 ml   Last 3 Weights 04/25/2021  Weight (lbs) 220 lb  Weight (kg) 99.791 kg      Telemetry    Not on telemetry - Personally Reviewed  ECG    No new tracings - Personally Reviewed  Physical Exam   GEN: No acute distress.   Neck: No JVD Cardiac: Irregularly irregular, no murmurs, rubs, or gallops.  Respiratory: Clear to auscultation bilaterally. GI: Soft, nontender, non-distended  MS: No edema; No deformity. Neuro:  Nonfocal  Psych: Normal affect   Labs    High Sensitivity Troponin:   Recent  Labs  Lab 04/25/21 1310 04/25/21 2154  TROPONINIHS 59* 63*     Chemistry Recent Labs  Lab 04/25/21 1310 04/26/21 0451 04/27/21 0826 04/27/21 1005 04/28/21 0504 04/29/21 0449  NA 132* 135 134*  --  134* 136  K 2.8* 3.1* 3.4*  --  3.2* 3.8  CL 88* 97* 96*  --  100 102  CO2 29 28 25   --  29 27  GLUCOSE 317* 144* 130*  --  99 112*  BUN 27* 18 13  --  12 11  CREATININE 1.03* 0.83 0.77  --  0.78 0.88  CALCIUM 10.2 8.9 9.2  --  8.6* 8.8*  MG 1.5* 1.7  --  1.8 1.9  --   PROT 8.4*  --   --   --   --   --   ALBUMIN 3.8  --   --   --   --   --   AST 31  --   --   --   --   --   ALT 17  --   --   --   --   --   ALKPHOS 67  --   --   --   --   --   BILITOT 1.2  --   --   --   --   --  GFRNONAA >60 >60 >60  --  >60 >60  ANIONGAP 15 10 13   --  5 7    Lipids No results for input(s): CHOL, TRIG, HDL, LABVLDL, LDLCALC, CHOLHDL in the last 168 hours.  Hematology Recent Labs  Lab 04/27/21 1005 04/28/21 0504 04/29/21 0449  WBC 15.8* 12.0* 8.9  RBC 4.99 4.46 4.71  HGB 14.8 13.1 13.8  HCT 41.7 37.3 39.7  MCV 83.6 83.6 84.3  MCH 29.7 29.4 29.3  MCHC 35.5 35.1 34.8  RDW 13.7 13.4 13.2  PLT 310 284 303   Thyroid  Recent Labs  Lab 04/25/21 1310  TSH 0.842  FREET4 1.13*    BNPNo results for input(s): BNP, PROBNP in the last 168 hours.  DDimer No results for input(s): DDIMER in the last 168 hours.   Radiology    DG ABD ACUTE 2+V W 1V CHEST  Result Date: 04/28/2021 IMPRESSION: Nonobstructive bowel gas pattern. Colonic air-fluid levels noted which may reflect acute diarrheal illness. Electronically Signed   By: 06/28/2021 M.D.   On: 04/28/2021 10:18    Cardiac Studies   Echocardiogram was done on October 1:   1. Left ventricular ejection fraction, by estimation, is 30 to 35%. The  left ventricle has moderately decreased function. The left ventricle  demonstrates global hypokinesis. The left ventricular internal cavity size  was borderline dilated. There is mild   left  ventricular hypertrophy. Left ventricular diastolic parameters are  indeterminate.   2. Right ventricular systolic function is low normal. The right  ventricular size is normal. There is normal pulmonary artery systolic  pressure.   3. Left atrial size was severely dilated.   4. The mitral valve is degenerative. Moderate to severe mitral valve  regurgitation. No evidence of mitral stenosis.   5. The aortic valve is tricuspid. There is mild thickening of the aortic  valve. Aortic valve regurgitation is not visualized. Mild aortic valve  sclerosis is present, with no evidence of aortic valve stenosis.   6. The inferior vena cava is normal in size with greater than 50%  respiratory variability, suggesting right atrial pressure of 3 mmHg.   Patient Profile     65 y.o. female with history of essential hypertension, diabetes mellitus and obesity who presented with abdominal pain thought to be due to ischemic colitis with lesions on the kidneys suspicious for embolic events.  Assessment & Plan    1.  Persistent atrial fibrillation: Onset is not known, but this could be chronic.  Ventricular rate is reasonably controlled with carvedilol and digoxin.  Renal function is normal.  Given suspected embolic events and CHA2DS2-VASc score of at least 5, long-term anticoagulation is recommended.  She has been started on Eliquis. If this is cost prohibitive in the outpatient setting, she will need to complete patient assistance paperwork, or we may need to consider Coumadin. Echo showed severely dilated left atrium and thus it has been felt it will be difficult to get her back in sinus rhythm with recommendation to continue with rate control. We will arrange for a follow up BMP and digoxin level in our office in a week.   2.  Systolic heart failure with an EF of 30 to 35%: She appears euvolemic and well compensated. Suspect that this is likely chronic, and could be related to tachy-mediated cardiomyopathy.   However, given her risk factors, ischemic heart disease cannot be excluded. For now, recommend medical therapy.  Continue current GDMT including Coreg, losartan, spironolactone, and digoxin. She will  require a right and left cardiac catheterization at some point in the near future and that can be done in the outpatient setting.  3.  Mitral regurgitation: Suspect that this is likely moderate and likely due to severe left atrial enlargement and dilated mitral valve annulus.  This can be evaluated with repeat echo after optimizing medical therapy for cardiomyopathy.   For questions or updates, please contact [facility] Please consult [email or web address] for contact info under        Signed, Eula Listen, PA-C  04/29/2021, 9:47 AM

## 2021-04-29 NOTE — Assessment & Plan Note (Signed)
EF found this hospitalization to be 30 to 35% with moderate to severe mitral regurgitation.    Appears euvolemic, asymptomatic.  Started on BB, losartan and spironolactone

## 2021-04-30 LAB — CULTURE, BLOOD (ROUTINE X 2)
Culture: NO GROWTH
Culture: NO GROWTH

## 2021-05-02 ENCOUNTER — Telehealth: Payer: Self-pay | Admitting: *Deleted

## 2021-05-02 ENCOUNTER — Other Ambulatory Visit (INDEPENDENT_AMBULATORY_CARE_PROVIDER_SITE_OTHER): Payer: Self-pay

## 2021-05-02 ENCOUNTER — Other Ambulatory Visit: Payer: Self-pay

## 2021-05-02 DIAGNOSIS — I4891 Unspecified atrial fibrillation: Secondary | ICD-10-CM

## 2021-05-02 DIAGNOSIS — Z79899 Other long term (current) drug therapy: Secondary | ICD-10-CM

## 2021-05-02 NOTE — Telephone Encounter (Signed)
-----   Message from Joline Maxcy sent at 05/01/2021  1:54 PM EDT ----- Regarding: FW: Digoxin level Scheduled for Monday . Please place order  ----- Message ----- From: Lennon Alstrom, PA-C Sent: 05/01/2021  11:01 AM EDT To: [facility] Scheduling Subject: FW: Digoxin level                              Phone a friend time while in triage again.  Can you help me to get this scheduled?  If there's something I am supposed to be doing on my end, let me know! ----- Message ----- From: Yvonne Kendall, MD Sent: 05/01/2021  10:56 AM EDT To: [facility] Triage Subject: Digoxin level                                  Hello,  Would you be able to arrange for the patient to come in for a digoxin level at her earliest convenience (ideally today or tomorrow), as she was discharged on Tuesday before a dig level could be run?  Please let me know if any questions or concerns arise.  Thanks.  Thayer Ohm

## 2021-05-02 NOTE — Telephone Encounter (Signed)
Orders placed.  Appt needs to be changed to lab visit so orders can be linked.  Thank you.

## 2021-05-03 LAB — DIGOXIN LEVEL: Digoxin, Serum: 0.7 ng/mL (ref 0.5–0.9)

## 2021-05-05 ENCOUNTER — Ambulatory Visit: Payer: Self-pay

## 2021-05-15 ENCOUNTER — Encounter: Payer: Self-pay | Admitting: Cardiology

## 2021-05-15 ENCOUNTER — Ambulatory Visit (INDEPENDENT_AMBULATORY_CARE_PROVIDER_SITE_OTHER): Payer: Self-pay | Admitting: Cardiology

## 2021-05-15 ENCOUNTER — Other Ambulatory Visit: Payer: Self-pay

## 2021-05-15 ENCOUNTER — Telehealth: Payer: Self-pay | Admitting: Cardiology

## 2021-05-15 VITALS — BP 130/72 | HR 89 | Wt 191.0 lb

## 2021-05-15 DIAGNOSIS — I429 Cardiomyopathy, unspecified: Secondary | ICD-10-CM

## 2021-05-15 DIAGNOSIS — I4819 Other persistent atrial fibrillation: Secondary | ICD-10-CM

## 2021-05-15 DIAGNOSIS — I10 Essential (primary) hypertension: Secondary | ICD-10-CM

## 2021-05-15 NOTE — Progress Notes (Signed)
Cardiology Office Note:    Date:  05/15/2021   ID:  Brooke Miles, DOB 04/23/1956, MRN [id number]  PCP:  System, Provider Not In   [facility] Providers Cardiologist:  Aniylah Avans Agbor-Etang, MD     Referring MD: No ref. provider found   Chief Complaint  Patient presents with   Other    Hospital follow up -- Meds reviewed verbally with patient.     History of Present Illness:    Brooke Miles is a 65 y.o. female with a hx of hypertension, diabetes, obesity presenting for follow-up.  Recently seen in the hospital due to abdominal pain, diagnosed with ischemic colitis.  Work-up in the hospital atrial fibrillation, echo showed EF 30 to 35% .she was started on Coreg, Eliquis, losartan, Aldactone, digoxin.  Outpatient right and left heart cath was planned to evaluate presence of ischemia although tachycardia induced etiology was being considered.  Symptoms of abdominal discomfort are resolved.  Denies chest pain or shortness of breath.  Denies edema.  Feels well.  Denies palpitations, dizziness, syncope.  Prior notes Echocardiogram 04/2021 EF 30 to 35%.  Past Medical History:  Diagnosis Date   Diabetes mellitus without complication (HCC)    Hyperlipidemia    Hypertension     Past Surgical History:  Procedure Laterality Date   CESAREAN SECTION      Current Medications: Current Meds  Medication Sig   apixaban (ELIQUIS) 5 MG TABS tablet Take 1 tablet (5 mg total) by mouth 2 (two) times daily.   carvedilol (COREG) 25 MG tablet Take 1 tablet (25 mg total) by mouth 2 (two) times daily with a meal.   diclofenac Sodium (VOLTAREN) 1 % GEL Apply 4 g topically 4 (four) times daily as needed for pain.   digoxin (LANOXIN) 0.125 MG tablet Take 1 tablet (0.125 mg total) by mouth once daily.   losartan (COZAAR) 25 MG tablet Take 1 tablet (25 mg total) by mouth once daily.   metFORMIN (GLUCOPHAGE-XR) 500 MG 24 hr tablet Take 500 mg by mouth 2 (two) times daily.   spironolactone (ALDACTONE) 25 MG  tablet Take 1 tablet (25 mg total) by mouth once daily.     Allergies:   Quinolones   Social History   Socioeconomic History   Marital status: Married    Spouse name: Not on file   Number of children: Not on file   Years of education: Not on file   Highest education level: Not on file  Occupational History   Not on file  Tobacco Use   Smoking status: Never   Smokeless tobacco: Never  Substance and Sexual Activity   Alcohol use: Not Currently   Drug use: Not on file   Sexual activity: Not on file  Other Topics Concern   Not on file  Social History Narrative   Not on file   Social Determinants of Health   Financial Resource Strain: Not on file  Food Insecurity: Not on file  Transportation Needs: Not on file  Physical Activity: Not on file  Stress: Not on file  Social Connections: Not on file     Family History: The patient's family history is not on file.  ROS:   Please see the history of present illness.     All other systems reviewed and are negative.  EKGs/Labs/Other Studies Reviewed:    The following studies were reviewed today:   EKG:  EKG is  ordered today.  The ekg ordered today demonstrates atrial fibrillation, heart rate 89    Recent Labs: 04/25/2021: ALT 17; TSH 0.842 04/28/2021: Magnesium 1.9 04/29/2021: BUN 11; Creatinine, Ser 0.88; Hemoglobin 13.8; Platelets 303; Potassium 3.8; Sodium 136  Recent Lipid Panel No results found for: CHOL, TRIG, HDL, CHOLHDL, VLDL, LDLCALC, LDLDIRECT   Risk Assessment/Calculations:         Physical Exam:    VS:  BP 130/72 (BP Location: Left Arm, Patient Position: Sitting, Cuff Size: Normal)   Pulse 89   Wt 191 lb (86.6 kg)   SpO2 97%   BMI 29.04 kg/m     Wt Readings from Last 3 Encounters:  05/15/21 191 lb (86.6 kg)  04/25/21 220 lb (99.8 kg)     GEN:  Well nourished, well developed in no acute distress HEENT: Normal NECK: No JVD; No carotid bruits LYMPHATICS: No lymphadenopathy CARDIAC: Irregularly  irregular, nontachycardic RESPIRATORY:  Clear to auscultation without rales, wheezing or rhonchi  ABDOMEN: Soft, non-tender, non-distended MUSCULOSKELETAL:  No edema; No deformity  SKIN: Warm and dry NEUROLOGIC:  Alert and oriented x 3 PSYCHIATRIC:  Normal affect   ASSESSMENT:    1. Cardiomyopathy, unspecified type (HCC)   2. Persistent atrial fibrillation (HCC)   3. Primary hypertension    PLAN:    In order of problems listed above:  Cardiomyopathy, EF 30 to 35%.  Describes NYHA class II symptoms.  Appears euvolemic, continue Coreg 25 mg twice daily, losartan 25, Aldactone 25.  CAD left heart cath to evaluate CAD.  If no significant obstruction, tachycardia mediated also possible.  Hold Eliquis 48 hours prior to left heart cath. Persistent A. fib, heart rate controlled.  Continue Coreg.  Left heart cath as above, if no significant obstruction, will consider DC cardioversion after uninterrupted anticoagulation. Hypertension, BP controlled.  Continue Coreg, losartan, Aldactone.  Follow-up after left heart cath.  Shared Decision Making/Informed Consent The risks [stroke (1 in 1000), death (1 in 1000), kidney failure [usually temporary] (1 in 500), bleeding (1 in 200), allergic reaction [possibly serious] (1 in 200)], benefits (diagnostic support and management of coronary artery disease) and alternatives of a cardiac catheterization were discussed in detail with Brooke Miles and she is willing to proceed.   Medication Adjustments/Labs and Tests Ordered: Current medicines are reviewed at length with the patient today.  Concerns regarding medicines are outlined above.  Orders Placed This Encounter  Procedures   EKG 12-Lead    No orders of the defined types were placed in this encounter.   Patient Instructions  Medication Instructions:  Your physician recommends that you continue on your current medications as directed. Please refer to the Current Medication list given to you today.   *If you need a refill on your cardiac medications before your next appointment, please call your pharmacy*   Lab Work: None ordered If you have labs (blood work) drawn today and your tests are completely normal, you will receive your results only by: MyChart Message (if you have MyChart) OR A paper copy in the mail If you have any lab test that is abnormal or we need to change your treatment, we will call you to review the results.   Testing/Procedures:   [facility]ARDIOVASCULAR [facility] [address] [place], [address] [place] [address] Dept: [phone] Loc: [phone]  Brooke Miles  05/15/2021  You are scheduled for a Cardiac Catheterization on Friday, October 28 with Dr. Cristal [place].  1. Please arrive at [facility] at 6:30 AM (This time is one hour before your procedure to  ensure your preparation). Free valet parking service is available.   Special note: Every effort is made to have your procedure done on time. Please understand that emergencies sometimes delay scheduled procedures.  2. Diet: Do not eat solid foods after midnight.  The patient may have clear liquids until 5am upon the day of the procedure.  3. Labs: current labs on file  4. Medication instructions in preparation for your procedure:   Contrast Allergy: No  Stop taking Eliquis (Apixiban) on Wednesday, October 26.  Stop taking, Spironolactone Friday, October 28,  Do not take Diabetes Med Glucophage (Metformin) on the day of the procedure and HOLD 48 HOURS AFTER THE PROCEDURE.  On the morning of your procedure, take an  Aspirin 81 Mg and any morning medicines NOT listed above.  You may use sips of water.  5. Plan for one night stay--bring personal belongings. 6. Bring a current list of your medications and current insurance cards. 7. You MUST have a responsible person to drive you home. 8. Someone MUST be with you the first 24 hours  after you arrive home or your discharge will be delayed. 9. Please wear clothes that are easy to get on and off and wear slip-on shoes.  Thank you for allowing us to care for you!   -- [facility]    Follow-Up: At [facility], you and your health needs are our priority.  As part of our continuing mission to provide you with exceptional heart care, we have created designated Provider Care Teams.  These Care Teams include your primary Cardiologist (physician) and Advanced Practice Providers (APPs -  Physician Assistants and Nurse Practitioners) who all work together to provide you with the care you need, when you need it.  We recommend signing up for the patient portal called "MyChart".  Sign up information is provided on this After Visit Summary.  MyChart is used to connect with patients for Virtual Visits (Telemedicine).  Patients are able to view lab/test results, encounter notes, upcoming appointments, etc.  Non-urgent messages can be sent to your provider as well.   To learn more about what you can do with MyChart, go to [email or web address].    Your next appointment:   4-5 weeks  The format for your next appointment:   In Person  Provider:   Tunis Gentle Agbor-Etang, MD   Other Instructions    Signed, Elianne Gubser Agbor-Etang, MD  05/15/2021 4:54 PM    [facility]  

## 2021-05-15 NOTE — H&P (View-Only) (Signed)
Cardiology Office Note:    Date:  05/15/2021   ID:  Brooke Miles, DOB 03-30-1956, MRN [id number]  PCP:  System, Provider Not In   [facility] Providers Cardiologist:  Debbe Odea, MD     Referring MD: No ref. provider found   Chief Complaint  Patient presents with   Other    Hospital follow up -- Meds reviewed verbally with patient.     History of Present Illness:    Brooke Miles is a 65 y.o. female with a hx of hypertension, diabetes, obesity presenting for follow-up.  Recently seen in the hospital due to abdominal pain, diagnosed with ischemic colitis.  Work-up in the hospital atrial fibrillation, echo showed EF 30 to 35% .she was started on Coreg, Eliquis, losartan, Aldactone, digoxin.  Outpatient right and left heart cath was planned to evaluate presence of ischemia although tachycardia induced etiology was being considered.  Symptoms of abdominal discomfort are resolved.  Denies chest pain or shortness of breath.  Denies edema.  Feels well.  Denies palpitations, dizziness, syncope.  Prior notes Echocardiogram 04/2021 EF 30 to 35%.  Past Medical History:  Diagnosis Date   Diabetes mellitus without complication (HCC)    Hyperlipidemia    Hypertension     Past Surgical History:  Procedure Laterality Date   CESAREAN SECTION      Current Medications: Current Meds  Medication Sig   apixaban (ELIQUIS) 5 MG TABS tablet Take 1 tablet (5 mg total) by mouth 2 (two) times daily.   carvedilol (COREG) 25 MG tablet Take 1 tablet (25 mg total) by mouth 2 (two) times daily with a meal.   diclofenac Sodium (VOLTAREN) 1 % GEL Apply 4 g topically 4 (four) times daily as needed for pain.   digoxin (LANOXIN) 0.125 MG tablet Take 1 tablet (0.125 mg total) by mouth once daily.   losartan (COZAAR) 25 MG tablet Take 1 tablet (25 mg total) by mouth once daily.   metFORMIN (GLUCOPHAGE-XR) 500 MG 24 hr tablet Take 500 mg by mouth 2 (two) times daily.   spironolactone (ALDACTONE) 25 MG  tablet Take 1 tablet (25 mg total) by mouth once daily.     Allergies:   Quinolones   Social History   Socioeconomic History   Marital status: Married    Spouse name: Not on file   Number of children: Not on file   Years of education: Not on file   Highest education level: Not on file  Occupational History   Not on file  Tobacco Use   Smoking status: Never   Smokeless tobacco: Never  Substance and Sexual Activity   Alcohol use: Not Currently   Drug use: Not on file   Sexual activity: Not on file  Other Topics Concern   Not on file  Social History Narrative   Not on file   Social Determinants of Health   Financial Resource Strain: Not on file  Food Insecurity: Not on file  Transportation Needs: Not on file  Physical Activity: Not on file  Stress: Not on file  Social Connections: Not on file     Family History: The patient's family history is not on file.  ROS:   Please see the history of present illness.     All other systems reviewed and are negative.  EKGs/Labs/Other Studies Reviewed:    The following studies were reviewed today:   EKG:  EKG is  ordered today.  The ekg ordered today demonstrates atrial fibrillation, heart rate 89  Recent Labs: 04/25/2021: ALT 17; TSH 0.842 04/28/2021: Magnesium 1.9 04/29/2021: BUN 11; Creatinine, Ser 0.88; Hemoglobin 13.8; Platelets 303; Potassium 3.8; Sodium 136  Recent Lipid Panel No results found for: CHOL, TRIG, HDL, CHOLHDL, VLDL, LDLCALC, LDLDIRECT   Risk Assessment/Calculations:         Physical Exam:    VS:  BP 130/72 (BP Location: Left Arm, Patient Position: Sitting, Cuff Size: Normal)   Pulse 89   Wt 191 lb (86.6 kg)   SpO2 97%   BMI 29.04 kg/m     Wt Readings from Last 3 Encounters:  05/15/21 191 lb (86.6 kg)  04/25/21 220 lb (99.8 kg)     GEN:  Well nourished, well developed in no acute distress HEENT: Normal NECK: No JVD; No carotid bruits LYMPHATICS: No lymphadenopathy CARDIAC: Irregularly  irregular, nontachycardic RESPIRATORY:  Clear to auscultation without rales, wheezing or rhonchi  ABDOMEN: Soft, non-tender, non-distended MUSCULOSKELETAL:  No edema; No deformity  SKIN: Warm and dry NEUROLOGIC:  Alert and oriented x 3 PSYCHIATRIC:  Normal affect   ASSESSMENT:    1. Cardiomyopathy, unspecified type (HCC)   2. Persistent atrial fibrillation (HCC)   3. Primary hypertension    PLAN:    In order of problems listed above:  Cardiomyopathy, EF 30 to 35%.  Describes NYHA class II symptoms.  Appears euvolemic, continue Coreg 25 mg twice daily, losartan 25, Aldactone 25.  CAD left heart cath to evaluate CAD.  If no significant obstruction, tachycardia mediated also possible.  Hold Eliquis 48 hours prior to left heart cath. Persistent A. fib, heart rate controlled.  Continue Coreg.  Left heart cath as above, if no significant obstruction, will consider DC cardioversion after uninterrupted anticoagulation. Hypertension, BP controlled.  Continue Coreg, losartan, Aldactone.  Follow-up after left heart cath.  Shared Decision Making/Informed Consent The risks [stroke (1 in 1000), death (1 in 1000), kidney failure [usually temporary] (1 in 500), bleeding (1 in 200), allergic reaction [possibly serious] (1 in 200)], benefits (diagnostic support and management of coronary artery disease) and alternatives of a cardiac catheterization were discussed in detail with Brooke Miles and she is willing to proceed.   Medication Adjustments/Labs and Tests Ordered: Current medicines are reviewed at length with the patient today.  Concerns regarding medicines are outlined above.  Orders Placed This Encounter  Procedures   EKG 12-Lead    No orders of the defined types were placed in this encounter.   Patient Instructions  Medication Instructions:  Your physician recommends that you continue on your current medications as directed. Please refer to the Current Medication list given to you today.   *If you need a refill on your cardiac medications before your next appointment, please call your pharmacy*   Lab Work: None ordered If you have labs (blood work) drawn today and your tests are completely normal, you will receive your results only by: MyChart Message (if you have MyChart) OR A paper copy in the mail If you have any lab test that is abnormal or we need to change your treatment, we will call you to review the results.   Testing/Procedures:   [facility] CARDIOVASCULAR DIVISION [facility] [address] [place], [address] [place] [address] Dept: [phone] Loc: [phone]  Brooke Miles  05/15/2021  You are scheduled for a Cardiac Catheterization on Friday, October 28 with Dr. Cristal Deer End.  1. Please arrive at the [facility] at 6:30 AM (This time is one hour before your procedure to  ensure your preparation). Free valet parking service is available.   Special note: Every effort is made to have your procedure done on time. Please understand that emergencies sometimes delay scheduled procedures.  2. Diet: Do not eat solid foods after midnight.  The patient may have clear liquids until 5am upon the day of the procedure.  3. Labs: current labs on file  4. Medication instructions in preparation for your procedure:   Contrast Allergy: No  Stop taking Eliquis (Apixiban) on Wednesday, October 26.  Stop taking, Spironolactone Friday, October 28,  Do not take Diabetes Med Glucophage (Metformin) on the day of the procedure and HOLD 48 HOURS AFTER THE PROCEDURE.  On the morning of your procedure, take an  Aspirin 81 Mg and any morning medicines NOT listed above.  You may use sips of water.  5. Plan for one night stay--bring personal belongings. 6. Bring a current list of your medications and current insurance cards. 7. You MUST have a responsible person to drive you home. 8. Someone MUST be with you the first 24 hours  after you arrive home or your discharge will be delayed. 9. Please wear clothes that are easy to get on and off and wear slip-on shoes.  Thank you for allowing [place] to care for you!   -- [place] [facility]    Follow-Up: At [facility], you and your health needs are our priority.  As part of our continuing mission to provide you with exceptional heart care, we have created designated Provider Care Teams.  These Care Teams include your primary Cardiologist (physician) and Advanced Practice Providers (APPs -  Physician Assistants and Nurse Practitioners) who all work together to provide you with the care you need, when you need it.  We recommend signing up for the patient portal called "MyChart".  Sign up information is provided on this After Visit Summary.  MyChart is used to connect with patients for Virtual Visits (Telemedicine).  Patients are able to view lab/test results, encounter notes, upcoming appointments, etc.  Non-urgent messages can be sent to your provider as well.   To learn more about what you can do with MyChart, go to [email or web address].    Your next appointment:   4-5 weeks  The format for your next appointment:   In Person  Provider:   Debbe Odea, MD   Other Instructions    Signed, Debbe Odea, MD  05/15/2021 4:54 PM    [facility]

## 2021-05-15 NOTE — Telephone Encounter (Signed)
Patient's son called and states meds should be sent to [facility]. Patient was seen this morning

## 2021-05-15 NOTE — Patient Instructions (Addendum)
Medication Instructions:  Your physician recommends that you continue on your current medications as directed. Please refer to the Current Medication list given to you today.  *If you need a refill on your cardiac medications before your next appointment, please call your pharmacy*   Lab Work: None ordered If you have labs (blood work) drawn today and your tests are completely normal, you will receive your results only by: MyChart Message (if you have MyChart) OR A paper copy in the mail If you have any lab test that is abnormal or we need to change your treatment, we will call you to review the results.   Testing/Procedures:   [facility] CARDIOVASCULAR DIVISION [facility] [address] [place], [address] [place] [address] Dept: [phone] Loc: [phone]  Brooke Miles  05/15/2021  You are scheduled for a Cardiac Catheterization on Friday, October 28 with Dr. Cristal Deer End.  1. Please arrive at the [facility] at 6:30 AM (This time is one hour before your procedure to ensure your preparation). Free valet parking service is available.   Special note: Every effort is made to have your procedure done on time. Please understand that emergencies sometimes delay scheduled procedures.  2. Diet: Do not eat solid foods after midnight.  The patient may have clear liquids until 5am upon the day of the procedure.  3. Labs: current labs on file  4. Medication instructions in preparation for your procedure:   Contrast Allergy: No  Stop taking Eliquis (Apixiban) on Wednesday, October 26.  Stop taking, Spironolactone Friday, October 28,  Do not take Diabetes Med Glucophage (Metformin) on the day of the procedure and HOLD 48 HOURS AFTER THE PROCEDURE.  On the morning of your procedure, take an  Aspirin 81 Mg and any morning medicines NOT listed above.  You may use sips of water.  5. Plan for one night stay--bring personal belongings. 6.  Bring a current list of your medications and current insurance cards. 7. You MUST have a responsible person to drive you home. 8. Someone MUST be with you the first 24 hours after you arrive home or your discharge will be delayed. 9. Please wear clothes that are easy to get on and off and wear slip-on shoes.  Thank you for allowing [place] to care for you!   -- [place] [facility]    Follow-Up: At [facility], you and your health needs are our priority.  As part of our continuing mission to provide you with exceptional heart care, we have created designated Provider Care Teams.  These Care Teams include your primary Cardiologist (physician) and Advanced Practice Providers (APPs -  Physician Assistants and Nurse Practitioners) who all work together to provide you with the care you need, when you need it.  We recommend signing up for the patient portal called "MyChart".  Sign up information is provided on this After Visit Summary.  MyChart is used to connect with patients for Virtual Visits (Telemedicine).  Patients are able to view lab/test results, encounter notes, upcoming appointments, etc.  Non-urgent messages can be sent to your provider as well.   To learn more about what you can do with MyChart, go to [email or web address].    Your next appointment:   4-5 weeks  The format for your next appointment:   In Person  Provider:   Debbe Odea, MD   Other Instructions

## 2021-05-15 NOTE — Telephone Encounter (Signed)
Spoke with patient regarding refills and told him he still has refills with Medication management. The patient is understanding and will contact the pharmacy to pick up at the Medication management pharmacy.

## 2021-05-16 ENCOUNTER — Other Ambulatory Visit: Payer: Self-pay

## 2021-05-23 ENCOUNTER — Encounter
Admission: RE | Disposition: A | Discharge status: Home / Self Care | Payer: Self-pay | Source: Home / Self Care | Attending: Internal Medicine

## 2021-05-23 ENCOUNTER — Other Ambulatory Visit: Payer: Self-pay

## 2021-05-23 ENCOUNTER — Encounter: Payer: Self-pay | Admitting: Internal Medicine

## 2021-05-23 ENCOUNTER — Ambulatory Visit
Admission: RE | Admit: 2021-05-23 | Discharge: 2021-05-23 | Disposition: A | Discharge status: Home / Self Care | Payer: Self-pay | Attending: Internal Medicine | Admitting: Internal Medicine

## 2021-05-23 DIAGNOSIS — I5020 Unspecified systolic (congestive) heart failure: Secondary | ICD-10-CM | POA: Insufficient documentation

## 2021-05-23 DIAGNOSIS — E669 Obesity, unspecified: Secondary | ICD-10-CM | POA: Insufficient documentation

## 2021-05-23 DIAGNOSIS — Z7901 Long term (current) use of anticoagulants: Secondary | ICD-10-CM | POA: Insufficient documentation

## 2021-05-23 DIAGNOSIS — Z79899 Other long term (current) drug therapy: Secondary | ICD-10-CM | POA: Insufficient documentation

## 2021-05-23 DIAGNOSIS — Z7984 Long term (current) use of oral hypoglycemic drugs: Secondary | ICD-10-CM | POA: Insufficient documentation

## 2021-05-23 DIAGNOSIS — E119 Type 2 diabetes mellitus without complications: Secondary | ICD-10-CM | POA: Insufficient documentation

## 2021-05-23 DIAGNOSIS — I110 Hypertensive heart disease with heart failure: Secondary | ICD-10-CM | POA: Insufficient documentation

## 2021-05-23 DIAGNOSIS — I2510 Atherosclerotic heart disease of native coronary artery without angina pectoris: Secondary | ICD-10-CM | POA: Insufficient documentation

## 2021-05-23 DIAGNOSIS — I5022 Chronic systolic (congestive) heart failure: Secondary | ICD-10-CM | POA: Diagnosis present

## 2021-05-23 DIAGNOSIS — I429 Cardiomyopathy, unspecified: Secondary | ICD-10-CM | POA: Insufficient documentation

## 2021-05-23 DIAGNOSIS — Z6829 Body mass index (BMI) 29.0-29.9, adult: Secondary | ICD-10-CM | POA: Insufficient documentation

## 2021-05-23 DIAGNOSIS — I4819 Other persistent atrial fibrillation: Secondary | ICD-10-CM | POA: Insufficient documentation

## 2021-05-23 HISTORY — PX: LEFT HEART CATH AND CORONARY ANGIOGRAPHY: CATH118249

## 2021-05-23 LAB — DIGOXIN LEVEL: Digoxin Level: 1.7 ng/mL (ref 0.8–2.0)

## 2021-05-23 LAB — BASIC METABOLIC PANEL
Anion gap: 7 (ref 5–15)
BUN: 21 mg/dL (ref 8–23)
CO2: 25 mmol/L (ref 22–32)
Calcium: 10.9 mg/dL — ABNORMAL HIGH (ref 8.9–10.3)
Chloride: 103 mmol/L (ref 98–111)
Creatinine, Ser: 0.98 mg/dL (ref 0.44–1.00)
GFR, Estimated: >60 mL/min (ref >60)
Glucose, Bld: 170 mg/dL — ABNORMAL HIGH (ref 70–99)
Potassium: 4.3 mmol/L (ref 3.5–5.1)
Sodium: 135 mmol/L (ref 135–145)

## 2021-05-23 LAB — CBC
HCT: 40 % (ref 36.0–46.0)
Hemoglobin: 14.3 g/dL (ref 12.0–15.0)
MCH: 29.8 pg (ref 26.0–34.0)
MCHC: 35.8 g/dL (ref 30.0–36.0)
MCV: 83.3 fL (ref 80.0–100.0)
Platelets: 266 10*3/uL (ref 150–400)
RBC: 4.8 MIL/uL (ref 3.87–5.11)
RDW: 13.4 % (ref 11.5–15.5)
WBC: 5.7 10*3/uL (ref 4.0–10.5)
nRBC: 0 % (ref 0.0–0.2)

## 2021-05-23 LAB — GLUCOSE, CAPILLARY
Glucose-Capillary: 184 mg/dL — ABNORMAL HIGH (ref 70–99)
Glucose-Capillary: 133 mg/dL — ABNORMAL HIGH (ref 70–99)

## 2021-05-23 SURGERY — LEFT HEART CATH AND CORONARY ANGIOGRAPHY
Anesthesia: Moderate Sedation

## 2021-05-23 MED ORDER — HEPARIN SODIUM (PORCINE) 1000 UNIT/ML IJ SOLN
INTRAMUSCULAR | Status: AC
  Filled 2021-05-23: qty 1

## 2021-05-23 MED ORDER — HEPARIN SODIUM (PORCINE) 1000 UNIT/ML IJ SOLN
INTRAMUSCULAR | Status: DC | PRN
  Administered 2021-05-23: 4500 [IU] via INTRAVENOUS

## 2021-05-23 MED ORDER — IOHEXOL 350 MG/ML SOLN
INTRAVENOUS | Status: DC | PRN
  Administered 2021-05-23: 28 mL

## 2021-05-23 MED ORDER — FENTANYL CITRATE (PF) 100 MCG/2ML IJ SOLN
INTRAMUSCULAR | Status: AC
  Filled 2021-05-23: qty 2

## 2021-05-23 MED ORDER — VERAPAMIL HCL 2.5 MG/ML IV SOLN
INTRAVENOUS | Status: AC
  Filled 2021-05-23: qty 2

## 2021-05-23 MED ORDER — MIDAZOLAM HCL 2 MG/2ML IJ SOLN
INTRAMUSCULAR | Status: DC | PRN
  Administered 2021-05-23: 1 mg via INTRAVENOUS

## 2021-05-23 MED ORDER — VERAPAMIL HCL 2.5 MG/ML IV SOLN
INTRAVENOUS | Status: DC | PRN
  Administered 2021-05-23 (×2): 2.5 mg via INTRA_ARTERIAL

## 2021-05-23 MED ORDER — ASPIRIN 81 MG PO CHEW: 81.0000 mg | CHEWABLE_TABLET | ORAL | Status: AC

## 2021-05-23 MED ORDER — SODIUM CHLORIDE 0.9 % IV SOLN: INTRAVENOUS | Status: DC

## 2021-05-23 MED ORDER — SODIUM CHLORIDE 0.9% FLUSH: 3.0000 mL | INTRAVENOUS | Status: DC | PRN

## 2021-05-23 MED ORDER — LOSARTAN POTASSIUM 50 MG PO TABS
50.0000 mg | ORAL_TABLET | Freq: Every day | ORAL | 5 refills | Status: DC
  Filled 2021-05-23 – 2021-05-26 (×2): qty 30, 30d supply, fill #0

## 2021-05-23 MED ORDER — MIDAZOLAM HCL 2 MG/2ML IJ SOLN
INTRAMUSCULAR | Status: AC
  Filled 2021-05-23: qty 2

## 2021-05-23 MED ORDER — ACETAMINOPHEN 325 MG PO TABS: 650.0000 mg | ORAL_TABLET | ORAL | Status: DC | PRN

## 2021-05-23 MED ORDER — ONDANSETRON HCL 4 MG/2ML IJ SOLN: 4.0000 mg | Freq: Four times a day (QID) | INTRAMUSCULAR | Status: DC | PRN

## 2021-05-23 MED ORDER — SODIUM CHLORIDE 0.9% FLUSH: 3.0000 mL | Freq: Two times a day (BID) | INTRAVENOUS | Status: DC

## 2021-05-23 MED ORDER — SODIUM CHLORIDE 0.9 % IV SOLN: 250.0000 mL | INTRAVENOUS | Status: DC | PRN

## 2021-05-23 MED ORDER — ASPIRIN 81 MG PO CHEW
CHEWABLE_TABLET | ORAL | Status: AC
  Administered 2021-05-23: 81 mg via ORAL
  Filled 2021-05-23: qty 1

## 2021-05-23 MED ORDER — HYDRALAZINE HCL 20 MG/ML IJ SOLN: 10.0000 mg | INTRAMUSCULAR | Status: DC | PRN

## 2021-05-23 MED ORDER — LIDOCAINE HCL (PF) 1 % IJ SOLN
INTRAMUSCULAR | Status: DC | PRN
  Administered 2021-05-23: 2 mL

## 2021-05-23 MED ORDER — HEPARIN (PORCINE) IN NACL 1000-0.9 UT/500ML-% IV SOLN
INTRAVENOUS | Status: DC | PRN
  Administered 2021-05-23 (×2): 500 mL

## 2021-05-23 MED ORDER — FENTANYL CITRATE (PF) 100 MCG/2ML IJ SOLN
INTRAMUSCULAR | Status: DC | PRN
  Administered 2021-05-23: 25 ug via INTRAVENOUS

## 2021-05-23 MED ORDER — HEPARIN (PORCINE) IN NACL 1000-0.9 UT/500ML-% IV SOLN
INTRAVENOUS | Status: AC
  Filled 2021-05-23: qty 1000

## 2021-05-23 MED ORDER — LIDOCAINE HCL 1 % IJ SOLN
INTRAMUSCULAR | Status: AC
  Filled 2021-05-23: qty 20

## 2021-05-23 MED ORDER — LABETALOL HCL 5 MG/ML IV SOLN: 10.0000 mg | INTRAVENOUS | Status: DC | PRN

## 2021-05-23 SURGICAL SUPPLY — 12 items
CATH 5F 110X4 TIG (CATHETERS) ×2 IMPLANT
DEVICE RAD TR BAND REGULAR (VASCULAR PRODUCTS) ×2 IMPLANT
DRAPE BRACHIAL (DRAPES) ×2 IMPLANT
GLIDESHEATH SLEND SS 6F .021 (SHEATH) ×2 IMPLANT
GUIDEWIRE INQWIRE 1.5J.035X260 (WIRE) ×1 IMPLANT
INQWIRE 1.5J .035X260CM (WIRE) ×2
PACK CARDIAC CATH (CUSTOM PROCEDURE TRAY) ×2 IMPLANT
PROTECTION STATION PRESSURIZED (MISCELLANEOUS) ×2
SET ATX SIMPLICITY (MISCELLANEOUS) ×2 IMPLANT
SHEATH GLIDE SLENDER 4/5FR (SHEATH) ×2 IMPLANT
STATION PROTECTION PRESSURIZED (MISCELLANEOUS) ×1 IMPLANT
WIRE HITORQ VERSACORE ST 145CM (WIRE) ×2 IMPLANT

## 2021-05-23 NOTE — Interval H&P Note (Signed)
History and Physical Interval Note:  05/23/2021 4:26 PM  Brooke Miles  has presented today for surgery, with the diagnosis of HFrEF.  The various methods of treatment have been discussed with the patient and family. After consideration of risks, benefits and other options for treatment, the patient has consented to  Procedure(s): LEFT HEART CATH AND CORONARY ANGIOGRAPHY (N/A) as a surgical intervention.  The patient's history has been reviewed, patient examined, no change in status, stable for surgery.  I have reviewed the patient's chart and labs.  Questions were answered to the patient's satisfaction.    Cath Lab Visit (complete for each Cath Lab visit)  Clinical Evaluation Leading to the Procedure:   ACS: No.  Non-ACS:    Anginal/Heart Failure Classification: NYHA II  Anti-ischemic medical therapy: Maximal Therapy (2 or more classes of medications)  Non-Invasive Test Results: No non-invasive testing performed (LVEF 30-35% by echo -> high risk)  Prior CABG: No previous CABG  Roseline Ebarb

## 2021-05-26 ENCOUNTER — Other Ambulatory Visit: Payer: Self-pay

## 2021-05-26 ENCOUNTER — Encounter: Payer: Self-pay | Admitting: Internal Medicine

## 2021-06-03 ENCOUNTER — Other Ambulatory Visit: Payer: Self-pay

## 2021-06-04 ENCOUNTER — Ambulatory Visit: Payer: Self-pay

## 2021-06-10 ENCOUNTER — Other Ambulatory Visit: Payer: Self-pay

## 2021-06-10 ENCOUNTER — Ambulatory Visit
Admission: RE | Admit: 2021-06-10 | Discharge: 2021-06-10 | Disposition: A | Discharge status: Home / Self Care | Payer: Self-pay | Source: Ambulatory Visit | Attending: Oncology | Admitting: Oncology

## 2021-06-10 ENCOUNTER — Other Ambulatory Visit: Payer: Self-pay | Admitting: Physician Assistant

## 2021-06-10 ENCOUNTER — Ambulatory Visit: Payer: Self-pay | Attending: Oncology

## 2021-06-10 DIAGNOSIS — Z0000 Encounter for general adult medical examination without abnormal findings: Secondary | ICD-10-CM

## 2021-06-10 NOTE — Progress Notes (Signed)
Patient pre-screened for BCCCP eligibility due to COVID 19 precautions. Two patient identifiers used for verification that I was speaking to correct patient.  Patient to Present directly to [facility] today for BCCCP screening mammogram.  °

## 2021-06-11 NOTE — Progress Notes (Signed)
Letter mailed from [facility] to notify of normal mammogram results.  Patient to return in one year for annual screening.  Copy to [facility]. 

## 2021-06-13 ENCOUNTER — Other Ambulatory Visit: Payer: Self-pay

## 2021-06-13 ENCOUNTER — Ambulatory Visit (INDEPENDENT_AMBULATORY_CARE_PROVIDER_SITE_OTHER): Payer: Self-pay | Admitting: Cardiology

## 2021-06-13 ENCOUNTER — Encounter: Payer: Self-pay | Admitting: Cardiology

## 2021-06-13 VITALS — BP 100/78 | HR 88 | Wt 191.0 lb

## 2021-06-13 DIAGNOSIS — I10 Essential (primary) hypertension: Secondary | ICD-10-CM

## 2021-06-13 DIAGNOSIS — I4819 Other persistent atrial fibrillation: Secondary | ICD-10-CM

## 2021-06-13 DIAGNOSIS — I2510 Atherosclerotic heart disease of native coronary artery without angina pectoris: Secondary | ICD-10-CM

## 2021-06-13 DIAGNOSIS — I429 Cardiomyopathy, unspecified: Secondary | ICD-10-CM

## 2021-06-13 MED ORDER — APIXABAN 5 MG PO TABS: 5.0000 mg | ORAL_TABLET | Freq: Two times a day (BID) | ORAL | 1 refills | Status: DC

## 2021-06-13 MED ORDER — ATORVASTATIN CALCIUM 40 MG PO TABS: 40.0000 mg | ORAL_TABLET | Freq: Every day | ORAL | 1 refills | Status: AC

## 2021-06-13 MED ORDER — SPIRONOLACTONE 25 MG PO TABS: 25.0000 mg | ORAL_TABLET | Freq: Every day | ORAL | 1 refills | Status: AC

## 2021-06-13 MED ORDER — LOSARTAN POTASSIUM 100 MG PO TABS: 100.0000 mg | ORAL_TABLET | Freq: Every day | ORAL | 1 refills | Status: AC

## 2021-06-13 NOTE — Telephone Encounter (Signed)
Eliquis 5 mg refill request received. Patient is 65 years old, weight-86.6 kg, Crea-0.98 on 05/23/21, Diagnosis-afib, and last seen by Dr. Azucena Cecil on 06/13/21. Dose is appropriate based on dosing criteria. Will send in refill to requested pharmacy.

## 2021-06-13 NOTE — Progress Notes (Signed)
Cardiology Office Note:    Date:  06/13/2021   ID:  Brooke Miles, DOB 05-20-1956, MRN [id number]  PCP:  [facility]   [facility] Providers Cardiologist:  Kate Sable, MD     Referring MD: No ref. provider found   Chief Complaint  Patient presents with   Other    4-5 wk f/u no complaints today. Meds reviewed verbally with pt.    History of Present Illness:    Brooke Miles is a 65 y.o. female with a hx of nonobstructive CAD (lhc 04/2021 90% D1, 60% mid LAD, 70% ostial OM 2), CHF EF 30 to 35%, persistent atrial fibrillation, hypertension, diabetes, obesity presenting for follow-up.  Being seen for cardiomyopathy, left heart cath was performed showing nonobstructive disease.  Losartan was increased to 50 mg daily.  Currently takes Coreg, Aldactone.  Denies chest pain, palpitations, edema, shortness of breath.  She takes Eliquis 5 mg daily and not twice daily.  Plans to go out of country for 3 months.  Prior notes Echocardiogram 04/2021 EF 30 to 35%. Left heart cath 04/2021, 90% D1, 60% mid LAD, 70% ostial OM 2.  Past Medical History:  Diagnosis Date   Diabetes mellitus without complication ([place])    Hyperlipidemia    Hypertension     Past Surgical History:  Procedure Laterality Date   CESAREAN SECTION     LEFT HEART CATH AND CORONARY ANGIOGRAPHY N/A 05/23/2021   Procedure: LEFT HEART CATH AND CORONARY ANGIOGRAPHY;  Surgeon: Nelva Bush, MD;  Location: [facility];  Service: Cardiovascular;  Laterality: N/A;    Current Medications: Current Meds  Medication Sig   atorvastatin (LIPITOR) 40 MG tablet Take 1 tablet (40 mg total) by mouth daily.   carvedilol (COREG) 25 MG tablet Take 1 tablet (25 mg total) by mouth 2 (two) times daily with a meal.   diclofenac Sodium (VOLTAREN) 1 % GEL Apply 4 g topically 4 (four) times daily as needed for pain.   metFORMIN (GLUCOPHAGE-XR) 500 MG 24 hr tablet Take 500 mg by mouth 2  (two) times daily.   [DISCONTINUED] amLODipine (NORVASC) 5 MG tablet Take 5 mg by mouth daily.   [DISCONTINUED] apixaban (ELIQUIS) 5 MG TABS tablet Take 1 tablet (5 mg total) by mouth 2 (two) times daily.   [DISCONTINUED] chlorthalidone (HYGROTON) 25 MG tablet Take 25 mg by mouth daily.   [DISCONTINUED] losartan (COZAAR) 50 MG tablet Take 1 tablet (50 mg total) by mouth once daily.   [DISCONTINUED] spironolactone (ALDACTONE) 25 MG tablet Take 1 tablet (25 mg total) by mouth once daily.     Allergies:   Quinolones   Social History   Socioeconomic History   Marital status: Married    Spouse name: Not on file   Number of children: Not on file   Years of education: Not on file   Highest education level: Not on file  Occupational History   Not on file  Tobacco Use   Smoking status: Never   Smokeless tobacco: Never  Substance and Sexual Activity   Alcohol use: Not Currently   Drug use: Not on file   Sexual activity: Not on file  Other Topics Concern   Not on file  Social History Narrative   Not on file   Social Determinants of Health   Financial Resource Strain: Not on file  Food Insecurity: Not on file  Transportation Needs: Not on file  Physical Activity: Not on file  Stress: Not on  file  Social Connections: Not on file     Family History: The patient's family history is not on file.  ROS:   Please see the history of present illness.     All other systems reviewed and are negative.  EKGs/Labs/Other Studies Reviewed:    The following studies were reviewed today:   EKG:  EKG is  ordered today.  The ekg ordered today demonstrates atrial fibrillation, heart rate 89  Recent Labs: 04/25/2021: ALT 17; TSH 0.842 04/28/2021: Magnesium 1.9 05/23/2021: BUN 21; Creatinine, Ser 0.98; Hemoglobin 14.3; Platelets 266; Potassium 4.3; Sodium 135  Recent Lipid Panel No results found for: CHOL, TRIG, HDL, CHOLHDL, VLDL, LDLCALC, LDLDIRECT   Risk Assessment/Calculations:          Physical Exam:    VS:  BP 100/78 (BP Location: Left Arm, Patient Position: Sitting, Cuff Size: Normal)   Pulse 88   Wt 191 lb (86.6 kg)   SpO2 97%   BMI 29.04 kg/m     Wt Readings from Last 3 Encounters:  06/13/21 191 lb (86.6 kg)  05/15/21 191 lb (86.6 kg)  04/25/21 220 lb (99.8 kg)     GEN:  Well nourished, well developed in no acute distress HEENT: Normal NECK: No JVD; No carotid bruits LYMPHATICS: No lymphadenopathy CARDIAC: Irregularly irregular, nontachycardic RESPIRATORY:  Clear to auscultation without rales, wheezing or rhonchi  ABDOMEN: Soft, non-tender, non-distended MUSCULOSKELETAL:  No edema; No deformity  SKIN: Warm and dry NEUROLOGIC:  Alert and oriented x 3 PSYCHIATRIC:  Normal affect   ASSESSMENT:    1. Coronary artery disease involving native coronary artery of native heart without angina pectoris   2. Cardiomyopathy, unspecified type (HCC)   3. Persistent atrial fibrillation (HCC)   4. Primary hypertension     PLAN:    In order of problems listed above:  CAD.  90% D1, 60% mid LAD, 70% ostial OM 2.  Denies chest pain, on Eliquis.  Start Lipitor 40 mg daily. Cardiomyopathy, EF 30 to 35%.  Describes NYHA class II symptoms.  Appears euvolemic, continue Coreg 25 mg twice daily, Aldactone 25.  Increase losartan to 100 mg daily. Persistent A. fib, heart rate controlled.  Continue Coreg.  Advised to take Eliquis 5 mg twice daily.  Patient would like to see family/travel to [place] before performing any additional procedures.  Plan DC cardioversion when patient returns from trip. Hypertension, BP controlled.  Continue Coreg, losartan, Aldactone as above.  Stop Norvasc, stop chlorthalidone..  Follow-up in 4 months.  Plan DC cardioversion at follow-up visit.  Medication Adjustments/Labs and Tests Ordered: Current medicines are reviewed at length with the patient today.  Concerns regarding medicines are outlined above.  Orders Placed This Encounter   Procedures   EKG 12-Lead     Meds ordered this encounter  Medications   losartan (COZAAR) 100 MG tablet    Sig: Take 1 tablet (100 mg total) by mouth daily.    Dispense:  90 tablet    Refill:  1    Dosage increase   atorvastatin (LIPITOR) 40 MG tablet    Sig: Take 1 tablet (40 mg total) by mouth daily.    Dispense:  90 tablet    Refill:  1   spironolactone (ALDACTONE) 25 MG tablet    Sig: Take 1 tablet (25 mg total) by mouth once daily.    Dispense:  90 tablet    Refill:  1     Patient Instructions  Medication Instructions:  Your physician has recommended  you make the following change in your medication:   1) STOP Amlodipine  2) STOP Chlorthalidone   3) INCREASE Losartan to 100 mg daily.  4) START Atorvastatin 40 mg daily   Your cardiac medications have been refilled today for 90 day supply  *If you need a refill on your cardiac medications before your next appointment, please call your pharmacy*   Lab Work: None ordered  If you have labs (blood work) drawn today and your tests are completely normal, you will receive your results only by: [place] (if you have MyChart) OR A paper copy in the mail If you have any lab test that is abnormal or we need to change your treatment, we will call you to review the results.   Testing/Procedures: None ordered   Follow-Up: At [facility], you and your health needs are our priority.  As part of our continuing mission to provide you with exceptional heart care, we have created designated Provider Care Teams.  These Care Teams include your primary Cardiologist (physician) and Advanced Practice Providers (APPs -  Physician Assistants and Nurse Practitioners) who all work together to provide you with the care you need, when you need it.  We recommend signing up for the patient portal called "MyChart".  Sign up information is provided on this After Visit Summary.  MyChart is used to connect with patients for Virtual  Visits (Telemedicine).  Patients are able to view lab/test results, encounter notes, upcoming appointments, etc.  Non-urgent messages can be sent to your provider as well.   To learn more about what you can do with MyChart, go to [email or web address].    Your next appointment:   4 month(s)  The format for your next appointment:   In Person  Provider:   Kate Sable, MD     Other Instructions N/A   Signed, Kate Sable, MD  06/13/2021 5:14 PM    [place]

## 2021-06-13 NOTE — Patient Instructions (Signed)
Medication Instructions:  Your physician has recommended you make the following change in your medication:   1) STOP Amlodipine  2) STOP Chlorthalidone   3) INCREASE Losartan to 100 mg daily.  4) START Atorvastatin 40 mg daily   Your cardiac medications have been refilled today for 90 day supply  *If you need a refill on your cardiac medications before your next appointment, please call your pharmacy*   Lab Work: None ordered  If you have labs (blood work) drawn today and your tests are completely normal, you will receive your results only by: MyChart Message (if you have MyChart) OR A paper copy in the mail If you have any lab test that is abnormal or we need to change your treatment, we will call you to review the results.   Testing/Procedures: None ordered   Follow-Up: At [facility], you and your health needs are our priority.  As part of our continuing mission to provide you with exceptional heart care, we have created designated Provider Care Teams.  These Care Teams include your primary Cardiologist (physician) and Advanced Practice Providers (APPs -  Physician Assistants and Nurse Practitioners) who all work together to provide you with the care you need, when you need it.  We recommend signing up for the patient portal called "MyChart".  Sign up information is provided on this After Visit Summary.  MyChart is used to connect with patients for Virtual Visits (Telemedicine).  Patients are able to view lab/test results, encounter notes, upcoming appointments, etc.  Non-urgent messages can be sent to your provider as well.   To learn more about what you can do with MyChart, go to [email or web address].    Your next appointment:   4 month(s)  The format for your next appointment:   In Person  Provider:   Debbe Odea, MD     Other Instructions N/A

## 2021-06-16 ENCOUNTER — Other Ambulatory Visit: Payer: Self-pay

## 2021-06-16 ENCOUNTER — Ambulatory Visit: Payer: Self-pay | Admitting: Pharmacy Technician

## 2021-06-16 DIAGNOSIS — Z79899 Other long term (current) drug therapy: Secondary | ICD-10-CM

## 2021-06-17 ENCOUNTER — Other Ambulatory Visit: Payer: Self-pay

## 2021-06-17 ENCOUNTER — Emergency Department: Payer: Self-pay

## 2021-06-17 ENCOUNTER — Encounter: Payer: Self-pay | Admitting: Emergency Medicine

## 2021-06-17 ENCOUNTER — Inpatient Hospital Stay
Admission: EM | Admit: 2021-06-17 | Discharge: 2021-06-21 | DRG: 488 | Disposition: A | Discharge status: Home Health | Payer: Self-pay | Attending: Internal Medicine | Admitting: Internal Medicine

## 2021-06-17 DIAGNOSIS — M009 Pyogenic arthritis, unspecified: Secondary | ICD-10-CM | POA: Diagnosis present

## 2021-06-17 DIAGNOSIS — E119 Type 2 diabetes mellitus without complications: Secondary | ICD-10-CM

## 2021-06-17 DIAGNOSIS — M25462 Effusion, left knee: Principal | ICD-10-CM

## 2021-06-17 DIAGNOSIS — E876 Hypokalemia: Secondary | ICD-10-CM | POA: Diagnosis present

## 2021-06-17 DIAGNOSIS — I5022 Chronic systolic (congestive) heart failure: Secondary | ICD-10-CM | POA: Diagnosis present

## 2021-06-17 DIAGNOSIS — Z20822 Contact with and (suspected) exposure to covid-19: Secondary | ICD-10-CM | POA: Diagnosis present

## 2021-06-17 DIAGNOSIS — E1140 Type 2 diabetes mellitus with diabetic neuropathy, unspecified: Secondary | ICD-10-CM

## 2021-06-17 DIAGNOSIS — Z23 Encounter for immunization: Secondary | ICD-10-CM

## 2021-06-17 DIAGNOSIS — I4819 Other persistent atrial fibrillation: Secondary | ICD-10-CM | POA: Diagnosis present

## 2021-06-17 DIAGNOSIS — Z79899 Other long term (current) drug therapy: Secondary | ICD-10-CM

## 2021-06-17 DIAGNOSIS — Z7984 Long term (current) use of oral hypoglycemic drugs: Secondary | ICD-10-CM

## 2021-06-17 DIAGNOSIS — M25562 Pain in left knee: Secondary | ICD-10-CM

## 2021-06-17 DIAGNOSIS — E1169 Type 2 diabetes mellitus with other specified complication: Secondary | ICD-10-CM | POA: Diagnosis present

## 2021-06-17 DIAGNOSIS — M65162 Other infective (teno)synovitis, left knee: Principal | ICD-10-CM | POA: Diagnosis present

## 2021-06-17 DIAGNOSIS — Z888 Allergy status to other drugs, medicaments and biological substances status: Secondary | ICD-10-CM

## 2021-06-17 DIAGNOSIS — I152 Hypertension secondary to endocrine disorders: Secondary | ICD-10-CM | POA: Diagnosis present

## 2021-06-17 DIAGNOSIS — E785 Hyperlipidemia, unspecified: Secondary | ICD-10-CM | POA: Diagnosis present

## 2021-06-17 DIAGNOSIS — M23301 Other meniscus derangements, unspecified lateral meniscus, left knee: Secondary | ICD-10-CM | POA: Diagnosis present

## 2021-06-17 DIAGNOSIS — E1159 Type 2 diabetes mellitus with other circulatory complications: Secondary | ICD-10-CM

## 2021-06-17 DIAGNOSIS — E871 Hypo-osmolality and hyponatremia: Secondary | ICD-10-CM | POA: Diagnosis present

## 2021-06-17 DIAGNOSIS — M1712 Unilateral primary osteoarthritis, left knee: Secondary | ICD-10-CM | POA: Diagnosis present

## 2021-06-17 DIAGNOSIS — I2510 Atherosclerotic heart disease of native coronary artery without angina pectoris: Secondary | ICD-10-CM | POA: Diagnosis present

## 2021-06-17 DIAGNOSIS — Z7901 Long term (current) use of anticoagulants: Secondary | ICD-10-CM

## 2021-06-17 DIAGNOSIS — R7989 Other specified abnormal findings of blood chemistry: Secondary | ICD-10-CM | POA: Diagnosis present

## 2021-06-17 LAB — COMPREHENSIVE METABOLIC PANEL
ALT: 12 U/L (ref 0–44)
AST: 27 U/L (ref 15–41)
Albumin: 3.9 g/dL (ref 3.5–5.0)
Alkaline Phosphatase: 46 U/L (ref 38–126)
Anion gap: 11 (ref 5–15)
BUN: 14 mg/dL (ref 8–23)
CO2: 25 mmol/L (ref 22–32)
Calcium: 9.7 mg/dL (ref 8.9–10.3)
Chloride: 93 mmol/L — ABNORMAL LOW (ref 98–111)
Creatinine, Ser: 0.93 mg/dL (ref 0.44–1.00)
GFR, Estimated: >60 mL/min (ref >60)
Glucose, Bld: 333 mg/dL — ABNORMAL HIGH (ref 70–99)
Potassium: 3.2 mmol/L — ABNORMAL LOW (ref 3.5–5.1)
Sodium: 129 mmol/L — ABNORMAL LOW (ref 135–145)
Total Bilirubin: 0.9 mg/dL (ref 0.3–1.2)
Total Protein: 7.7 g/dL (ref 6.5–8.1)

## 2021-06-17 LAB — CBC WITH DIFFERENTIAL/PLATELET
Abs Immature Granulocytes: 0.04 10*3/uL (ref 0.00–0.07)
Basophils Absolute: 0 10*3/uL (ref 0.0–0.1)
Basophils Relative: 0 %
Eosinophils Absolute: 0 10*3/uL (ref 0.0–0.5)
Eosinophils Relative: 0 %
HCT: 39.3 % (ref 36.0–46.0)
Hemoglobin: 13.6 g/dL (ref 12.0–15.0)
Immature Granulocytes: 0 %
Lymphocytes Relative: 18 %
Lymphs Abs: 1.8 10*3/uL (ref 0.7–4.0)
MCH: 29 pg (ref 26.0–34.0)
MCHC: 34.6 g/dL (ref 30.0–36.0)
MCV: 83.8 fL (ref 80.0–100.0)
Monocytes Absolute: 0.5 10*3/uL (ref 0.1–1.0)
Monocytes Relative: 6 %
Neutro Abs: 7.5 10*3/uL (ref 1.7–7.7)
Neutrophils Relative %: 76 %
Platelets: 279 10*3/uL (ref 150–400)
RBC: 4.69 MIL/uL (ref 3.87–5.11)
RDW: 13.5 % (ref 11.5–15.5)
WBC: 9.8 10*3/uL (ref 4.0–10.5)
nRBC: 0 % (ref 0.0–0.2)

## 2021-06-17 LAB — URINALYSIS, ROUTINE W REFLEX MICROSCOPIC
Bilirubin Urine: NEGATIVE
Glucose, UA: >=500 mg/dL — AB
Hgb urine dipstick: NEGATIVE
Ketones, ur: NEGATIVE mg/dL
Leukocytes,Ua: NEGATIVE
Nitrite: NEGATIVE
Protein, ur: 100 mg/dL — AB
Specific Gravity, Urine: 1.013 (ref 1.005–1.030)
pH: 5 (ref 5.0–8.0)

## 2021-06-17 LAB — LACTIC ACID, PLASMA
Lactic Acid, Venous: 4.8 mmol/L (ref 0.5–1.9)
Lactic Acid, Venous: 2.7 mmol/L (ref 0.5–1.9)

## 2021-06-17 LAB — CHLAMYDIA/NGC RT PCR (ARMC ONLY)
Chlamydia Tr: NOT DETECTED
N gonorrhoeae: NOT DETECTED

## 2021-06-17 LAB — SYNOVIAL CELL COUNT + DIFF, W/ CRYSTALS
Crystals, Fluid: NONE SEEN
Eosinophils-Synovial: 0 %
Lymphocytes-Synovial Fld: 0 %
Monocyte-Macrophage-Synovial Fluid: 2 %
Neutrophil, Synovial: 98 %
Other Cells-SYN: 0
WBC, Synovial: 46445 /mm3 — ABNORMAL HIGH (ref 0–200)

## 2021-06-17 LAB — PROCALCITONIN: Procalcitonin: <0.1 ng/mL

## 2021-06-17 LAB — RESP PANEL BY RT-PCR (FLU A&B, COVID) ARPGX2
Influenza A by PCR: NEGATIVE
Influenza B by PCR: NEGATIVE
SARS Coronavirus 2 by RT PCR: NEGATIVE

## 2021-06-17 LAB — GLUCOSE, CAPILLARY: Glucose-Capillary: 294 mg/dL — ABNORMAL HIGH (ref 70–99)

## 2021-06-17 LAB — MAGNESIUM: Magnesium: 1.3 mg/dL — ABNORMAL LOW (ref 1.7–2.4)

## 2021-06-17 LAB — URIC ACID: Uric Acid, Serum: 7.3 mg/dL — ABNORMAL HIGH (ref 2.5–7.1)

## 2021-06-17 MED ORDER — HEPARIN SODIUM (PORCINE) 5000 UNIT/ML IJ SOLN: 5000.0000 [IU] | Freq: Three times a day (TID) | INTRAMUSCULAR | Status: DC

## 2021-06-17 MED ORDER — METFORMIN HCL ER 500 MG PO TB24
500.0000 mg | ORAL_TABLET | Freq: Two times a day (BID) | ORAL | Status: DC
  Filled 2021-06-17 (×2): qty 1

## 2021-06-17 MED ORDER — APIXABAN 5 MG PO TABS: 5.0000 mg | ORAL_TABLET | Freq: Two times a day (BID) | ORAL | Status: DC

## 2021-06-17 MED ORDER — SODIUM CHLORIDE 0.9 % IV SOLN
2.0000 g | Freq: Three times a day (TID) | INTRAVENOUS | Status: AC
  Administered 2021-06-17 – 2021-06-20 (×9): 2 g via INTRAVENOUS
  Filled 2021-06-17 (×12): qty 2

## 2021-06-17 MED ORDER — ACETAMINOPHEN 650 MG RE SUPP: 650.0000 mg | Freq: Four times a day (QID) | RECTAL | Status: AC | PRN

## 2021-06-17 MED ORDER — ACETAMINOPHEN 500 MG PO TABS: 1000.0000 mg | ORAL_TABLET | Freq: Four times a day (QID) | ORAL | Status: AC | PRN

## 2021-06-17 MED ORDER — LOSARTAN POTASSIUM 50 MG PO TABS
100.0000 mg | ORAL_TABLET | Freq: Every day | ORAL | Status: DC
  Administered 2021-06-18 – 2021-06-21 (×4): 100 mg via ORAL
  Filled 2021-06-17 (×4): qty 2

## 2021-06-17 MED ORDER — INSULIN ASPART 100 UNIT/ML IJ SOLN
0.0000 [IU] | Freq: Three times a day (TID) | INTRAMUSCULAR | Status: DC
  Administered 2021-06-18 (×3): 3 [IU] via SUBCUTANEOUS
  Administered 2021-06-19: 5 [IU] via SUBCUTANEOUS
  Administered 2021-06-19: 8 [IU] via SUBCUTANEOUS
  Administered 2021-06-19 – 2021-06-20 (×2): 5 [IU] via SUBCUTANEOUS
  Administered 2021-06-20: 11 [IU] via SUBCUTANEOUS
  Administered 2021-06-20: 5 [IU] via SUBCUTANEOUS
  Administered 2021-06-21: 3 [IU] via SUBCUTANEOUS
  Administered 2021-06-21: 5 [IU] via SUBCUTANEOUS
  Filled 2021-06-17 (×11): qty 1

## 2021-06-17 MED ORDER — ATORVASTATIN CALCIUM 20 MG PO TABS
40.0000 mg | ORAL_TABLET | Freq: Every day | ORAL | Status: DC
  Administered 2021-06-17 – 2021-06-20 (×4): 40 mg via ORAL
  Filled 2021-06-17 (×4): qty 2

## 2021-06-17 MED ORDER — SPIRONOLACTONE 25 MG PO TABS
25.0000 mg | ORAL_TABLET | Freq: Every day | ORAL | Status: DC
  Administered 2021-06-18 – 2021-06-21 (×4): 25 mg via ORAL
  Filled 2021-06-17 (×4): qty 1

## 2021-06-17 MED ORDER — POTASSIUM CHLORIDE CRYS ER 20 MEQ PO TBCR
40.0000 meq | EXTENDED_RELEASE_TABLET | Freq: Once | ORAL | Status: AC
  Administered 2021-06-17: 40 meq via ORAL
  Filled 2021-06-17: qty 2

## 2021-06-17 MED ORDER — INSULIN ASPART 100 UNIT/ML IJ SOLN
0.0000 [IU] | Freq: Every day | INTRAMUSCULAR | Status: DC
  Administered 2021-06-17: 3 [IU] via SUBCUTANEOUS
  Administered 2021-06-18: 4 [IU] via SUBCUTANEOUS
  Administered 2021-06-19: 3 [IU] via SUBCUTANEOUS
  Administered 2021-06-20: 2 [IU] via SUBCUTANEOUS
  Filled 2021-06-17 (×4): qty 1

## 2021-06-17 MED ORDER — LACTATED RINGERS IV BOLUS
1000.0000 mL | Freq: Once | INTRAVENOUS | Status: AC
  Administered 2021-06-17: 1000 mL via INTRAVENOUS

## 2021-06-17 MED ORDER — APIXABAN 5 MG PO TABS
5.0000 mg | ORAL_TABLET | Freq: Two times a day (BID) | ORAL | Status: DC
  Administered 2021-06-17: 5 mg via ORAL
  Filled 2021-06-17: qty 1

## 2021-06-17 MED ORDER — ONDANSETRON HCL 4 MG PO TABS: 4.0000 mg | ORAL_TABLET | Freq: Four times a day (QID) | ORAL | Status: DC | PRN

## 2021-06-17 MED ORDER — VANCOMYCIN HCL 1750 MG/350ML IV SOLN
1750.0000 mg | INTRAVENOUS | Status: DC
  Filled 2021-06-17: qty 350

## 2021-06-17 MED ORDER — VANCOMYCIN HCL IN DEXTROSE 1-5 GM/200ML-% IV SOLN
1000.0000 mg | Freq: Once | INTRAVENOUS | Status: DC
  Filled 2021-06-17: qty 200

## 2021-06-17 MED ORDER — ELIQUIS 5 MG PO TABS
5.0000 mg | ORAL_TABLET | Freq: Two times a day (BID) | ORAL | 1 refills | Status: AC
  Filled 2021-06-17: qty 60, 30d supply, fill #0
  Filled 2021-08-15: qty 60, 30d supply, fill #1

## 2021-06-17 MED ORDER — PNEUMOCOCCAL VAC POLYVALENT 25 MCG/0.5ML IJ INJ
0.5000 mL | INJECTION | INTRAMUSCULAR | Status: AC
  Administered 2021-06-18: 0.5 mL via INTRAMUSCULAR
  Filled 2021-06-17: qty 0.5

## 2021-06-17 MED ORDER — INFLUENZA VAC SPLIT QUAD 0.5 ML IM SUSY
0.5000 mL | PREFILLED_SYRINGE | INTRAMUSCULAR | Status: AC
  Administered 2021-06-18: 0.5 mL via INTRAMUSCULAR
  Filled 2021-06-17: qty 0.5

## 2021-06-17 MED ORDER — SODIUM CHLORIDE 0.9 % IV SOLN: INTRAVENOUS | Status: DC | PRN

## 2021-06-17 MED ORDER — ONDANSETRON HCL 4 MG/2ML IJ SOLN: 4.0000 mg | Freq: Four times a day (QID) | INTRAMUSCULAR | Status: DC | PRN

## 2021-06-17 MED ORDER — LIDOCAINE HCL (PF) 1 % IJ SOLN
5.0000 mL | Freq: Once | INTRAMUSCULAR | Status: AC
  Administered 2021-06-17: 5 mL
  Filled 2021-06-17: qty 5

## 2021-06-17 MED ORDER — CARVEDILOL 25 MG PO TABS
25.0000 mg | ORAL_TABLET | Freq: Two times a day (BID) | ORAL | Status: DC
  Administered 2021-06-17 – 2021-06-21 (×8): 25 mg via ORAL
  Filled 2021-06-17 (×8): qty 1

## 2021-06-17 MED ORDER — VANCOMYCIN HCL IN DEXTROSE 1-5 GM/200ML-% IV SOLN
1000.0000 mg | Freq: Once | INTRAVENOUS | Status: AC
  Administered 2021-06-17: 1000 mg via INTRAVENOUS
  Filled 2021-06-17: qty 200

## 2021-06-17 NOTE — Plan of Care (Signed)
  Problem: Education: Goal: Knowledge of General Education information will improve Description: Including pain rating scale, medication(s)/side effects and non-pharmacologic comfort measures Outcome: Progressing Note: Patient profile completed via translator. No complaints of pain. Skin intact. Patient is unable to bear weight on left leg.

## 2021-06-17 NOTE — Consult Note (Signed)
Pharmacy Antibiotic Note  Brooke Miles is a 65 y.o. female admitted on 06/17/2021 with  septic joint .  Pharmacy has been consulted for vancomycin and cefepime dosing.  Plan:  Cefepime 2 g IV q8h  Vancomycin 2 g IV LD followed by maintenance regimen of vancomycin 1.75 g IV q24h --Calculated AUC: 505, Cmin 11.2 --Daily Scr per protocol --Levels at steady state as clinically indicated  Height: 5\' 8"  (172.7 cm) Weight: 86.6 kg (191 lb) IBW/kg (Calculated) : 63.9  Temp (24hrs), Avg:98.6 F (37 C), Min:98.6 F (37 C), Max:98.6 F (37 C)  Recent Labs  Lab 06/17/21 1304  WBC 9.8  CREATININE 0.93  LATICACIDVEN 4.8*    Estimated Creatinine Clearance: 70.4 mL/min (by C-G formula based on SCr of 0.93 mg/dL).    Allergies  Allergen Reactions   Quinolones Hives and Itching    Antimicrobials this admission: Vancomycin 11/22 >>  Cefepime 11/22 >>   Dose adjustments this admission: N/A  Microbiology results: 11/22 BCx: pending 11/22 L knee synovial culture: pending 11/22 Chlamydia / gonococcal PCR: pending  Thank you for allowing pharmacy to be a part of this patient's care.  12/22 06/17/2021 8:19 PM

## 2021-06-17 NOTE — Progress Notes (Signed)
Completed [facility] application and contract.  Patient agreed to all terms of the [facility] contract.    Patient is married.  However, her husband is living in [place].  She was granted permission to come to the [place].  However, they are still trying to get her husband here.    Patient approved to receive medication assistance at [facility] until Nov 24, 2021.  Per son, Adelle Zachar, patient will be eligible for Medicare beginning 11/24/21.  Explained that patient would no longer be eligible for [facility] services once she is eligible for Medicare.    Provided patient with community resource material based on her particular needs.    Sherilyn Dacosta Care Manager [facility]

## 2021-06-17 NOTE — ED Triage Notes (Signed)
First Nurse Note:  Arrives with daughter in law.. sent to ED from [facility] for evaluation of left knee for possible septic joint.  Patient c/o swollen knee since Saturday.

## 2021-06-17 NOTE — ED Notes (Signed)
Lab called to help obtain blood cultures

## 2021-06-17 NOTE — ED Provider Notes (Signed)
[facility] Emergency Department Provider Note  ____________________________________________   I have reviewed the triage vital signs and the nursing notes.   HISTORY  Chief Complaint Joint Swelling (/)   History limited by:  Language barrier, some history obtained with help of daughter in law at bedside   HPI Keigan Ildiko Tissot is a 65 y.o. female who presents to the emergency department today because of concern for left knee pain and swelling.  The symptoms started the last few days.  They have been getting progressively worse.  It is very hard for the patient to walk around because of pain.  She in fact fell today because of the discomfort with walking.  Denies any trauma prior to the pain and swelling starting.  Denies similar symptoms in the past.  No recent travel.   Records reviewed. Per medical record review patient has a history of DM, HLD, HTN.  Past Medical History:  Diagnosis Date   Diabetes mellitus without complication ([place])    Hyperlipidemia    Hypertension     Patient Active Problem List   Diagnosis Date Noted   Ischemic enteritis ([place]) 04/27/2021   Elevated lactic acid level 04/27/2021   Demand ischemia ([place]) 04/26/2021   Abnormal CT scan, kidney [id number]   Chronic systolic CHF (congestive heart failure) ([place]) 04/26/2021   Hyponatremia 04/26/2021   Enteritis 04/25/2021   Atrial fibrillation with rapid ventricular response ([place]) 04/25/2021   Type 2 diabetes mellitus ([place]) 04/25/2021   Hypertension associated with diabetes ([place]) 04/25/2021   Hypokalemia 04/25/2021   Hypomagnesemia 04/25/2021    Past Surgical History:  Procedure Laterality Date   CESAREAN SECTION     LEFT HEART CATH AND CORONARY ANGIOGRAPHY N/A 05/23/2021   Procedure: LEFT HEART CATH AND CORONARY ANGIOGRAPHY;  Surgeon: Nelva Bush, MD;  Location: [place] [facility];  Service: Cardiovascular;  Laterality: N/A;    Prior to Admission medications    Medication Sig Start Date End Date Taking? Authorizing Provider  apixaban (ELIQUIS) 5 MG TABS tablet Take 1 tablet (5 mg total) by mouth 2 (two) times daily. 06/13/21   Kate Sable, MD  apixaban (ELIQUIS) 5 MG TABS tablet Take 1 tablet (5 mg total) by mouth 2 (two) times daily. 06/13/21   Kate Sable, MD  atorvastatin (LIPITOR) 40 MG tablet Take 1 tablet (40 mg total) by mouth daily. 06/13/21 09/11/21  Kate Sable, MD  carvedilol (COREG) 25 MG tablet Take 1 tablet (25 mg total) by mouth 2 (two) times daily with a meal. 04/28/21   Danford, Suann Larry, MD  diclofenac Sodium (VOLTAREN) 1 % GEL Apply 4 g topically 4 (four) times daily as needed for pain.    [provider]  losartan (COZAAR) 100 MG tablet Take 1 tablet (100 mg total) by mouth daily. 06/13/21 06/13/22  Kate Sable, MD  metFORMIN (GLUCOPHAGE-XR) 500 MG 24 hr tablet Take 500 mg by mouth 2 (two) times daily.    [provider]  spironolactone (ALDACTONE) 25 MG tablet Take 1 tablet (25 mg total) by mouth once daily. 06/13/21   Kate Sable, MD    Allergies Quinolones  History reviewed. No pertinent family history.  Social History Social History   Tobacco Use   Smoking status: Never   Smokeless tobacco: Never  Substance Use Topics   Alcohol use: Not Currently    Review of Systems Constitutional: No fever/chills Eyes: No visual changes. ENT: No sore throat. Cardiovascular: Denies chest pain. Respiratory: Denies shortness of breath. Gastrointestinal: No  abdominal pain.  No nausea, no vomiting.  No diarrhea.   Genitourinary: Negative for dysuria. Musculoskeletal: Positive for left knee pain and swelling. Skin: Negative for rash. Neurological: Negative for headaches, focal weakness or numbness.  ____________________________________________   PHYSICAL EXAM:  VITAL SIGNS: ED Triage Vitals  Enc Vitals Group     BP 06/17/21 1503 (!) 151/88     Pulse Rate 06/17/21  1503 95     Resp 06/17/21 1503 16     Temp 06/17/21 1503 98.6 F (37 C)     Temp Source 06/17/21 1503 Oral     SpO2 06/17/21 1503 96 %     Weight 06/17/21 1302 191 lb (86.6 kg)     Height 06/17/21 1302 5\' 8"  (1.727 m)     Head Circumference --      Peak Flow --      Pain Score 06/17/21 1302 10   Constitutional: Alert and oriented.  Eyes: Conjunctivae are normal.  ENT      Head: Normocephalic and atraumatic.      Nose: No congestion/rhinnorhea.      Mouth/Throat: Mucous membranes are moist.      Neck: No stridor. Hematological/Lymphatic/Immunilogical: No cervical lymphadenopathy. Cardiovascular: Normal rate, regular rhythm.  No murmurs, rubs, or gallops.  Respiratory: Normal respiratory effort without tachypnea nor retractions. Breath sounds are clear and equal bilaterally. No wheezes/rales/rhonchi. Gastrointestinal: Soft and non tender. No rebound. No guarding.  Genitourinary: Deferred Musculoskeletal: Large amount of swelling to left knee. Decreased ROM secondary to pain. No overlying skin changes.  Neurologic:  Normal speech and language. No gross focal neurologic deficits are appreciated.  Skin:  Skin is warm, dry and intact. No rash noted. Psychiatric: Mood and affect are normal. Speech and behavior are normal. Patient exhibits appropriate insight and judgment.  ____________________________________________    LABS (pertinent positives/negatives)  CBC wbc 9.8, hgb 13.6, plt 279 UA clear, rbc and wbc 0-5, negative nitrite and leukocytes, many bacteria.  CMP na 129, k 3.2, glu 333, cr 0.93 ____________________________________________   EKG  None  ____________________________________________    RADIOLOGY  Right knee No acute fracture or dislocation. Osteoarthritis with suprapatellar effusion.  CXR No acute abnormality ____________________________________________   PROCEDURES  .Joint Aspiration/Arthrocentesis  Date/Time: 06/17/2021 4:12 PM Performed by:  06/19/2021, MD Authorized by: Phineas Semen, MD   Consent:    Consent obtained:  Verbal   Consent given by:  Patient   Risks, benefits, and alternatives were discussed: yes     Risks discussed:  Infection Universal protocol:    Procedure explained and questions answered to patient or proxy's satisfaction: yes     Imaging studies available: yes   Location:    Location:  Knee   Knee:  L knee Anesthesia:    Anesthesia method:  Local infiltration   Local anesthetic:  Lidocaine 1% w/o epi Procedure details:    Preparation: Patient was prepped and draped in usual sterile fashion     Needle gauge:  18 G   Ultrasound guidance: no     Approach:  Lateral   Aspirate amount:  60 ml   Aspirate characteristics:  Bloody and cloudy   Steroid injected: no   Post-procedure details:    Dressing:  Gauze roll   Procedure completion:  Tolerated     ____________________________________________   INITIAL IMPRESSION / ASSESSMENT AND PLAN / ED COURSE  Pertinent labs & imaging results that were available during my care of the patient were reviewed by me and considered in  my medical decision making (see chart for details).   Patient presented to the emergency department today because of concerns for left knee swelling and pain.  Did have some concerns for possible septic arthritis given severely limited range of motion secondary to pain.  The fluid it was concerning for high white blood cell count and neutrophil predominance.  Discussed with Dr. Mack Guise with orthopedics.  At this time he recommends admission to the hospital service for further infectious source work-up. Will also consider operative wash out. Will start patient on antibiotics. Discussed findings and plan with patient.   ____________________________________________   FINAL CLINICAL IMPRESSION(S) / ED DIAGNOSES  Final diagnoses:  Pain and swelling of left knee     Note: This dictation was prepared with Dragon  dictation. Any transcriptional errors that result from this process are unintentional     Nance Pear, MD 06/17/21 [phone]

## 2021-06-17 NOTE — ED Triage Notes (Signed)
Pt comes into the ED via POV and sent by her MD due to left knee swelling, heat, fever,  and concerns for joint infection.  Per the family, this all started this weekend.  Pt denies any previous joint infections.  Pt does present with significant swelling.  Pt in NAD with even and unlabored respirations at this time.

## 2021-06-17 NOTE — H&P (Addendum)
History and Physical   Brooke Miles [id number] DOB: 09-Oct-1955 DOA: 06/17/2021  PCP: Brooke Miles, P.A.  Outpatient Specialists: Dr. Garen Lah, cardiology Patient coming from: [facility]  I have personally briefly reviewed patient's old medical records in [place].  Chief Concern: Left knee pain and swelling  HPI: Brooke Miles is a 65 y.o. female with medical history significant for hyperlipidemia, non-insulin-dependent diabetes mellitus, hypertension, on anticoagulation with Eliquis, atrial fibrillation, who presents emergency department for chief concerns of left knee pain.  At bedside that is hard, patient was able to meet her name, age, current location of hospital.  She endorses subjective fever.  She states that the swelling has started about 3 days ago.  No trauma.    She reports this is never happened before.  She denies any chest pain, shortness of breath, headaches, neck pain, cough, abdominal pain, dysuria, hematuria, diarrhea.  Social history: She denies history of tobacco, recreational drug use, alcohol use.  Vaccination history: She is vaccinated for COVID-19, 3 doses  ROS: Constitutional: no weight change, + fever ENT/Mouth: no sore throat, no rhinorrhea Eyes: no eye pain, no vision changes Cardiovascular: no chest pain, no dyspnea,  no edema, no palpitations Respiratory: no cough, no sputum, no wheezing Gastrointestinal: no nausea, no vomiting, no diarrhea, no constipation Genitourinary: no urinary incontinence, no dysuria, no hematuria Musculoskeletal: + arthralgias, no myalgias Skin: no skin lesions, no pruritus, Neuro: + weakness, no loss of consciousness, no syncope Psych: no anxiety, no depression, + decrease appetite Heme/Lymph: no bruising, no bleeding  ED Course: Discussed with emergency medicine provider, patient requiring hospitalization for chief concerns of septic joint.  Vitals in  the emergency department was remarkable for temperature of 98.6, respiration of 24, heart rate of 92, blood pressure 138/99, SPO2 of 97% on room air.  Sodium was 129, potassium 3.2, chloride 93, bicarb 25, BUN of 14, serum creatinine of 0.93, nonfasting blood glucose 333, WBC 9.8, hemoglobin 13.6, platelets 279.  Lactic acid was elevated at 4.8. UA was negative.  Blood cultures x2 have been ordered by EDP.  Patient received lidocaine one-time dose, LR 1 L bolus, and vancomycin.  In the emergency department EDP did a joint tap and in synovial fluid WBC was elevated at 46,445.  Assessment/Plan  Principal Problem:   Septic joint ([place]) Active Problems:   Type 2 diabetes mellitus (H[facility])   Hypertension associated with diabetes ([place])   Hypokalemia   Hyponatremia   Elevated lactic acid level   # Septic joint - Etiology work-up in progress - Follow-up on blood cultures - Ordered HIV, chlamydia gonorrhea screening, procalcitonin, check uric acid - Orthopedic surgeon has been consulted and states he will see the patient tomorrow for possible washout  # Non-insulin-dependent diabetes mellitus-resumed home metformin 500 mg p.o. twice daily - Insulin SSI with at bedtime coverage ordered - Goal blood glucose in the hospital is 140-180  # Hyperlipidemia-atorvastatin 40 mg nightly  # Heart failure reduced ejection fraction-appears euvolemic at this time  # Atrial fibrillation-resumed home Eliquis  Chart reviewed.   Complete echo on 04/26/2021: Ejection fraction was read as 30 to 35%, left ventricle has moderately decreased function, left ventricle demonstrates global hypokinesis.  Mild left ventricular hypertrophy.  DVT prophylaxis: Eliquis twice daily Code Status: Full code Diet: Heart healthy Family Communication: Updated daughter-in-law at bedside Disposition Plan: Pending clinical course Consults called: Orthopedic Admission status: MedSurg, observation, no telemetry  Past Medical  History:  Diagnosis Date  Diabetes mellitus without complication (H[facility])    Hyperlipidemia    Hypertension    Past Surgical History:  Procedure Laterality Date   CESAREAN SECTION     LEFT HEART CATH AND CORONARY ANGIOGRAPHY N/A 05/23/2021   Procedure: LEFT HEART CATH AND CORONARY ANGIOGRAPHY;  Surgeon: Yvonne Kendall, MD;  Location: [facility] INVASIVE CV LAB;  Service: Cardiovascular;  Laterality: N/A;   Social History:  reports that she has never smoked. She has never used smokeless tobacco. She reports that she does not currently use alcohol. No history on file for drug use.  Allergies  Allergen Reactions   Quinolones Hives and Itching   History reviewed. No pertinent family history. Family history: Family history reviewed and not pertinent  Prior to Admission medications   Medication Sig Start Date End Date Taking? Authorizing Provider  apixaban (ELIQUIS) 5 MG TABS tablet Take 1 tablet (5 mg total) by mouth 2 (two) times daily. 06/13/21   Debbe Odea, MD  apixaban (ELIQUIS) 5 MG TABS tablet Take 1 tablet (5 mg total) by mouth 2 (two) times daily. 06/13/21   Debbe Odea, MD  atorvastatin (LIPITOR) 40 MG tablet Take 1 tablet (40 mg total) by mouth daily. 06/13/21 09/11/21  Debbe Odea, MD  carvedilol (COREG) 25 MG tablet Take 1 tablet (25 mg total) by mouth 2 (two) times daily with a meal. 04/28/21   Danford, Earl Lites, MD  diclofenac Sodium (VOLTAREN) 1 % GEL Apply 4 g topically 4 (four) times daily as needed for pain.    [provider]  losartan (COZAAR) 100 MG tablet Take 1 tablet (100 mg total) by mouth daily. 06/13/21 06/13/22  Debbe Odea, MD  metFORMIN (GLUCOPHAGE-XR) 500 MG 24 hr tablet Take 500 mg by mouth 2 (two) times daily.    [provider]  spironolactone (ALDACTONE) 25 MG tablet Take 1 tablet (25 mg total) by mouth once daily. 06/13/21   Debbe Odea, MD   Physical Exam: Vitals:   06/17/21 1722 06/17/21 1800  06/17/21 1830 06/17/21 1900  BP: (!) 158/104 (!) 158/97 (!) 149/89 (!) 147/80  Pulse: 86 92 93 91  Resp: 20 13 (!) 21 15  Temp:      TempSrc:      SpO2: 100% 100% 98% 100%  Weight:      Height:       Constitutional: appears age-appropriate, NAD, calm, comfortable Eyes: PERRL, lids and conjunctivae normal ENMT: Mucous membranes are moist. Posterior pharynx clear of any exudate or lesions. Age-appropriate dentition. Hearing appropriate Neck: normal, supple, no masses, no thyromegaly Respiratory: clear to auscultation bilaterally, no wheezing, no crackles. Normal respiratory effort. No accessory muscle use.  Cardiovascular: Regular rate and rhythm, no murmurs / rubs / gallops. No extremity edema. 2+ pedal pulses. No carotid bruits.  Abdomen: Obese abdomen,, no tenderness, no masses palpated, no hepatosplenomegaly. Bowel sounds positive.  Musculoskeletal: no clubbing / cyanosis. No joint deformity upper and lower extremities. Good ROM, no contractures, no atrophy. Normal muscle tone.  Left knee warmth and tenderness Skin: no rashes, lesions, ulcers. No induration Neurologic: Sensation intact. Strength 5/5 in all 4.  Psychiatric: Normal judgment and insight. Alert and oriented x 3. Normal mood.   EKG: Not indicated at this time I reviewed the EKG from 06/13/2021 and it showed atrial fibrillation with rate of 88, QTc 438  Chest x-ray on Admission: I personally reviewed and I agree with radiologist reading as below.  DG Chest Portable 1 View  Result Date: 06/17/2021 CLINICAL DATA:  Lactic  acidosis EXAM: PORTABLE CHEST 1 VIEW COMPARISON:  None. FINDINGS: The heart size and mediastinal contours are within normal limits. Both lungs are clear. The visualized skeletal structures are unremarkable. IMPRESSION: No active disease. Electronically Signed   By: Randa Ngo M.D.   On: 06/17/2021 18:30   DG Knee Complete 4 Views Left  Result Date: 06/17/2021 CLINICAL DATA:  Left knee pain and  swelling EXAM: LEFT KNEE - COMPLETE 4+ VIEW COMPARISON:  None. FINDINGS: No acute fracture or dislocation. Tricompartmental joint space narrowing and marginal osteophytes. Moderate to large supra patellar joint effusion. Calcification on the medial aspect of the medial femoral condyle, likely sequela of prior ligamentous injury. IMPRESSION: 1.  No acute fracture or dislocation. 2. 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 Electronically Signed   By: Keane Police D.O.   On: 06/17/2021 14:11    Labs on Admission: I have personally reviewed following labs  CBC: Recent Labs  Lab 06/17/21 1304  WBC 9.8  NEUTROABS 7.5  HGB 13.6  HCT 39.3  MCV 83.8  PLT [id number]   Basic Metabolic Panel: Recent Labs  Lab 06/17/21 1304  NA 129*  K 3.2*  CL 93*  CO2 25  GLUCOSE 333*  BUN 14  CREATININE 0.93  CALCIUM 9.7   GFR: Estimated Creatinine Clearance: 70.4 mL/min (by C-G formula based on SCr of 0.93 mg/dL).  Liver Function Tests: Recent Labs  Lab 06/17/21 1304  AST 27  ALT 12  ALKPHOS 46  BILITOT 0.9  PROT 7.7  ALBUMIN 3.9   Urine analysis:    Component Value Date/Time   COLORURINE YELLOW (A) 06/17/2021 1503   APPEARANCEUR CLEAR (A) 06/17/2021 1503   LABSPEC 1.013 06/17/2021 1503   PHURINE 5.0 06/17/2021 1503   GLUCOSEU >=500 (A) 06/17/2021 1503   HGBUR NEGATIVE 06/17/2021 1503   BILIRUBINUR NEGATIVE 06/17/2021 1503   KETONESUR NEGATIVE 06/17/2021 1503   PROTEINUR 100 (A) 06/17/2021 1503   NITRITE NEGATIVE 06/17/2021 1503   LEUKOCYTESUR NEGATIVE 06/17/2021 1503   Dr. Tobie Poet [facility]  If 7PM-7AM, please contact overnight-coverage provider If 7AM-7PM, please contact day coverage provider [email or web address]  06/17/2021, 7:31 PM

## 2021-06-18 ENCOUNTER — Inpatient Hospital Stay: Payer: Self-pay | Admitting: Certified Registered Nurse Anesthetist

## 2021-06-18 ENCOUNTER — Encounter
Admission: EM | Disposition: A | Discharge status: Home Health | Payer: Self-pay | Source: Home / Self Care | Attending: Internal Medicine

## 2021-06-18 DIAGNOSIS — M009 Pyogenic arthritis, unspecified: Secondary | ICD-10-CM | POA: Diagnosis present

## 2021-06-18 HISTORY — PX: KNEE ARTHROSCOPY: SHX127

## 2021-06-18 HISTORY — PX: INCISION AND DRAINAGE: SHX5863

## 2021-06-18 LAB — CBC
HCT: 34.8 % — ABNORMAL LOW (ref 36.0–46.0)
Hemoglobin: 12.1 g/dL (ref 12.0–15.0)
MCH: 28.4 pg (ref 26.0–34.0)
MCHC: 34.8 g/dL (ref 30.0–36.0)
MCV: 81.7 fL (ref 80.0–100.0)
Platelets: 233 10*3/uL (ref 150–400)
RBC: 4.26 MIL/uL (ref 3.87–5.11)
RDW: 13.5 % (ref 11.5–15.5)
WBC: 8.8 10*3/uL (ref 4.0–10.5)
nRBC: 0 % (ref 0.0–0.2)

## 2021-06-18 LAB — BASIC METABOLIC PANEL
Anion gap: 7 (ref 5–15)
BUN: 12 mg/dL (ref 8–23)
CO2: 27 mmol/L (ref 22–32)
Calcium: 9.3 mg/dL (ref 8.9–10.3)
Chloride: 97 mmol/L — ABNORMAL LOW (ref 98–111)
Creatinine, Ser: 0.73 mg/dL (ref 0.44–1.00)
GFR, Estimated: >60 mL/min (ref >60)
Glucose, Bld: 208 mg/dL — ABNORMAL HIGH (ref 70–99)
Potassium: 3.3 mmol/L — ABNORMAL LOW (ref 3.5–5.1)
Sodium: 131 mmol/L — ABNORMAL LOW (ref 135–145)

## 2021-06-18 LAB — GLUCOSE, CAPILLARY
Glucose-Capillary: 200 mg/dL — ABNORMAL HIGH (ref 70–99)
Glucose-Capillary: 199 mg/dL — ABNORMAL HIGH (ref 70–99)
Glucose-Capillary: 171 mg/dL — ABNORMAL HIGH (ref 70–99)
Glucose-Capillary: 306 mg/dL — ABNORMAL HIGH (ref 70–99)

## 2021-06-18 LAB — HIV ANTIBODY (ROUTINE TESTING W REFLEX): HIV Screen 4th Generation wRfx: NONREACTIVE

## 2021-06-18 SURGERY — ARTHROSCOPY, KNEE
Anesthesia: General | Site: Knee | Laterality: Left

## 2021-06-18 MED ORDER — CHLORHEXIDINE GLUCONATE 0.12 % MT SOLN
OROMUCOSAL | Status: AC
  Filled 2021-06-18: qty 15

## 2021-06-18 MED ORDER — ONDANSETRON HCL 4 MG/2ML IJ SOLN
INTRAMUSCULAR | Status: DC | PRN
  Administered 2021-06-18: 4 mg via INTRAVENOUS

## 2021-06-18 MED ORDER — PROPOFOL 10 MG/ML IV BOLUS
INTRAVENOUS | Status: DC | PRN
  Administered 2021-06-18: 140 mg via INTRAVENOUS

## 2021-06-18 MED ORDER — APIXABAN 5 MG PO TABS
5.0000 mg | ORAL_TABLET | Freq: Two times a day (BID) | ORAL | Status: DC
  Administered 2021-06-19 – 2021-06-21 (×5): 5 mg via ORAL
  Filled 2021-06-18 (×5): qty 1

## 2021-06-18 MED ORDER — ADULT MULTIVITAMIN W/MINERALS CH
1.0000 | ORAL_TABLET | Freq: Every day | ORAL | Status: DC
Start: 2021-06-19 — End: 2021-06-21
  Administered 2021-06-19 – 2021-06-21 (×3): 1 via ORAL
  Filled 2021-06-18 (×3): qty 1

## 2021-06-18 MED ORDER — LACTATED RINGERS IV SOLN: INTRAVENOUS | Status: DC

## 2021-06-18 MED ORDER — ACETAMINOPHEN 10 MG/ML IV SOLN: 1000.0000 mg | Freq: Once | INTRAVENOUS | Status: DC | PRN

## 2021-06-18 MED ORDER — FENTANYL CITRATE (PF) 100 MCG/2ML IJ SOLN
25.0000 ug | INTRAMUSCULAR | Status: DC | PRN
  Administered 2021-06-18: 50 ug via INTRAVENOUS

## 2021-06-18 MED ORDER — LIDOCAINE HCL 1 % IJ SOLN
INTRAMUSCULAR | Status: DC | PRN
  Administered 2021-06-18: 5 mL

## 2021-06-18 MED ORDER — FENTANYL CITRATE (PF) 100 MCG/2ML IJ SOLN
INTRAMUSCULAR | Status: DC | PRN
  Administered 2021-06-18: 50 ug via INTRAVENOUS
  Administered 2021-06-18 (×2): 25 ug via INTRAVENOUS
  Administered 2021-06-18: 50 ug via INTRAVENOUS

## 2021-06-18 MED ORDER — FENTANYL CITRATE (PF) 100 MCG/2ML IJ SOLN
INTRAMUSCULAR | Status: AC
  Administered 2021-06-18: 50 ug via INTRAVENOUS
  Filled 2021-06-18: qty 2

## 2021-06-18 MED ORDER — ACETAMINOPHEN 10 MG/ML IV SOLN
INTRAVENOUS | Status: DC | PRN
  Administered 2021-06-18: 1000 mg via INTRAVENOUS

## 2021-06-18 MED ORDER — LIDOCAINE HCL (PF) 2 % IJ SOLN
INTRAMUSCULAR | Status: AC
  Filled 2021-06-18: qty 5

## 2021-06-18 MED ORDER — FENTANYL CITRATE (PF) 100 MCG/2ML IJ SOLN
INTRAMUSCULAR | Status: AC
  Filled 2021-06-18: qty 2

## 2021-06-18 MED ORDER — RINGERS IRRIGATION IR SOLN
Status: DC | PRN
  Administered 2021-06-18 (×3): 1
  Administered 2021-06-18: 2
  Administered 2021-06-18: 1

## 2021-06-18 MED ORDER — BUPIVACAINE HCL (PF) 0.5 % IJ SOLN
INTRAMUSCULAR | Status: DC | PRN
  Administered 2021-06-18: 30 mL via INTRA_ARTICULAR

## 2021-06-18 MED ORDER — BUPIVACAINE HCL (PF) 0.5 % IJ SOLN
INTRAMUSCULAR | Status: AC
  Filled 2021-06-18: qty 30

## 2021-06-18 MED ORDER — CEFAZOLIN SODIUM-DEXTROSE 2-4 GM/100ML-% IV SOLN
2.0000 g | INTRAVENOUS | Status: AC
  Administered 2021-06-18: 2 g via INTRAVENOUS

## 2021-06-18 MED ORDER — EPINEPHRINE PF 1 MG/ML IJ SOLN
INTRAMUSCULAR | Status: AC
  Filled 2021-06-18: qty 2

## 2021-06-18 MED ORDER — PROPOFOL 10 MG/ML IV BOLUS
INTRAVENOUS | Status: AC
  Filled 2021-06-18: qty 20

## 2021-06-18 MED ORDER — DEXAMETHASONE SODIUM PHOSPHATE 10 MG/ML IJ SOLN
INTRAMUSCULAR | Status: DC | PRN
  Administered 2021-06-18: 4 mg via INTRAVENOUS

## 2021-06-18 MED ORDER — ONDANSETRON HCL 4 MG/2ML IJ SOLN: 4.0000 mg | Freq: Once | INTRAMUSCULAR | Status: DC | PRN

## 2021-06-18 MED ORDER — MORPHINE SULFATE (PF) 2 MG/ML IV SOLN: 1.0000 mg | INTRAVENOUS | Status: DC | PRN

## 2021-06-18 MED ORDER — OXYCODONE HCL 5 MG PO TABS: 5.0000 mg | ORAL_TABLET | Freq: Once | ORAL | Status: DC | PRN

## 2021-06-18 MED ORDER — NEOMYCIN-POLYMYXIN B GU 40-200000 IR SOLN
Status: AC
  Filled 2021-06-18: qty 2

## 2021-06-18 MED ORDER — CEFAZOLIN SODIUM-DEXTROSE 2-4 GM/100ML-% IV SOLN
INTRAVENOUS | Status: AC
  Filled 2021-06-18: qty 100

## 2021-06-18 MED ORDER — MIDAZOLAM HCL 2 MG/2ML IJ SOLN
INTRAMUSCULAR | Status: AC
  Filled 2021-06-18: qty 2

## 2021-06-18 MED ORDER — PHENYLEPHRINE HCL (PRESSORS) 10 MG/ML IV SOLN
INTRAVENOUS | Status: DC | PRN
  Administered 2021-06-18: 160 ug via INTRAVENOUS
  Administered 2021-06-18: 80 ug via INTRAVENOUS
  Administered 2021-06-18: 160 ug via INTRAVENOUS
  Administered 2021-06-18: 80 ug via INTRAVENOUS

## 2021-06-18 MED ORDER — EPINEPHRINE PF 1 MG/ML IJ SOLN
INTRAMUSCULAR | Status: DC | PRN
  Administered 2021-06-18: 2 mg

## 2021-06-18 MED ORDER — ONDANSETRON HCL 4 MG/2ML IJ SOLN
INTRAMUSCULAR | Status: AC
  Filled 2021-06-18: qty 2

## 2021-06-18 MED ORDER — EPHEDRINE SULFATE 50 MG/ML IJ SOLN
INTRAMUSCULAR | Status: DC | PRN
  Administered 2021-06-18: 5 mg via INTRAVENOUS
  Administered 2021-06-18: 10 mg via INTRAVENOUS
  Administered 2021-06-18: 5 mg via INTRAVENOUS

## 2021-06-18 MED ORDER — LIDOCAINE HCL (CARDIAC) PF 100 MG/5ML IV SOSY
PREFILLED_SYRINGE | INTRAVENOUS | Status: DC | PRN
  Administered 2021-06-18: 100 mg via INTRAVENOUS

## 2021-06-18 MED ORDER — OXYCODONE HCL 5 MG PO TABS
5.0000 mg | ORAL_TABLET | ORAL | Status: DC | PRN
Start: 2021-06-18 — End: 2021-06-21
  Administered 2021-06-19 (×2): 10 mg via ORAL
  Administered 2021-06-19: 5 mg via ORAL
  Administered 2021-06-20: 10 mg via ORAL
  Administered 2021-06-21: 5 mg via ORAL
  Filled 2021-06-18 (×3): qty 2
  Filled 2021-06-18 (×2): qty 1

## 2021-06-18 MED ORDER — GLUCERNA SHAKE PO LIQD
237.0000 mL | Freq: Three times a day (TID) | ORAL | Status: DC
  Administered 2021-06-19 – 2021-06-21 (×7): 237 mL via ORAL

## 2021-06-18 MED ORDER — TRAMADOL HCL 50 MG PO TABS: 50.0000 mg | ORAL_TABLET | Freq: Four times a day (QID) | ORAL | Status: DC | PRN

## 2021-06-18 MED ORDER — LIDOCAINE HCL (PF) 1 % IJ SOLN
INTRAMUSCULAR | Status: AC
  Filled 2021-06-18: qty 30

## 2021-06-18 MED ORDER — OXYCODONE HCL 5 MG/5ML PO SOLN: 5.0000 mg | Freq: Once | ORAL | Status: DC | PRN

## 2021-06-18 MED ORDER — SODIUM CHLORIDE FLUSH 0.9 % IV SOLN
INTRAVENOUS | Status: AC
  Filled 2021-06-18: qty 10

## 2021-06-18 MED ORDER — VANCOMYCIN HCL 2000 MG/400ML IV SOLN
2000.0000 mg | INTRAVENOUS | Status: DC
  Administered 2021-06-18: 2000 mg via INTRAVENOUS
  Filled 2021-06-18 (×2): qty 400

## 2021-06-18 MED ORDER — ACETAMINOPHEN 10 MG/ML IV SOLN
INTRAVENOUS | Status: AC
  Filled 2021-06-18: qty 100

## 2021-06-18 SURGICAL SUPPLY — 75 items
ADAPTER IRRIG TUBE 2 SPIKE SOL (ADAPTER) IMPLANT
BLADE FULL RADIUS 3.5 (BLADE) IMPLANT
BLADE SHAVER 4.5X7 STR FR (MISCELLANEOUS) IMPLANT
BNDG COHESIVE 6X5 TAN ST LF (GAUZE/BANDAGES/DRESSINGS) ×2 IMPLANT
BUR BR 5.5 WIDE MOUTH (BURR) IMPLANT
CNTNR SPEC 2.5X3XGRAD LEK (MISCELLANEOUS) ×2
CONT SPEC 4OZ STER OR WHT (MISCELLANEOUS) ×2
CONTAINER SPEC 2.5X3XGRAD LEK (MISCELLANEOUS) ×2 IMPLANT
COOLER POLAR GLACIER W/PUMP (MISCELLANEOUS) IMPLANT
CUFF TOURN SGL QUICK 18X4 (TOURNIQUET CUFF) IMPLANT
CUFF TOURN SGL QUICK 24 (TOURNIQUET CUFF) ×1
CUFF TOURN SGL QUICK 34 (TOURNIQUET CUFF)
CUFF TRNQT CYL 24X4X16.5-23 (TOURNIQUET CUFF) ×1 IMPLANT
CUFF TRNQT CYL 34X4.125X (TOURNIQUET CUFF) IMPLANT
DEVICE SUCT BLK HOLE OR FLOOR (MISCELLANEOUS) IMPLANT
DRAPE 3/4 80X56 (DRAPES) ×2 IMPLANT
DRAPE ARTHRO LIMB 89X125 STRL (DRAPES) ×2 IMPLANT
DRAPE IMP U-DRAPE 54X76 (DRAPES) ×2 IMPLANT
DRAPE INCISE IOBAN 66X60 STRL (DRAPES) ×2 IMPLANT
DRAPE SURG 17X11 SM STRL (DRAPES) ×4 IMPLANT
DRAPE U-SHAPE 47X51 STRL (DRAPES) ×2 IMPLANT
DURAPREP 26ML APPLICATOR (WOUND CARE) ×4 IMPLANT
ELECT CAUTERY BLADE 6.4 (BLADE) ×2 IMPLANT
ELECT REM PT RETURN 9FT ADLT (ELECTROSURGICAL) ×2
ELECTRODE REM PT RTRN 9FT ADLT (ELECTROSURGICAL) ×1 IMPLANT
GAUZE 4X4 16PLY ~~LOC~~+RFID DBL (SPONGE) IMPLANT
GAUZE SPONGE 4X4 12PLY STRL (GAUZE/BANDAGES/DRESSINGS) ×2 IMPLANT
GAUZE XEROFORM 1X8 LF (GAUZE/BANDAGES/DRESSINGS) ×2 IMPLANT
GLOVE SURG ORTHO LTX SZ9 (GLOVE) ×6 IMPLANT
GLOVE SURG UNDER POLY LF SZ9 (GLOVE) ×2 IMPLANT
GOWN STRL REUS TWL 2XL XL LVL4 (GOWN DISPOSABLE) ×2 IMPLANT
GOWN STRL REUS W/ TWL LRG LVL3 (GOWN DISPOSABLE) ×1 IMPLANT
GOWN STRL REUS W/TWL LRG LVL3 (GOWN DISPOSABLE) ×1
HEMOVAC 400ML (MISCELLANEOUS)
IV LACTATED RINGER IRRG 3000ML (IV SOLUTION) ×4
IV LR IRRIG 3000ML ARTHROMATIC (IV SOLUTION) ×4 IMPLANT
KIT DRAIN HEMOVAC JP 7FR 400ML (MISCELLANEOUS) IMPLANT
KIT TURNOVER KIT A (KITS) ×2 IMPLANT
MANIFOLD NEPTUNE II (INSTRUMENTS) ×4 IMPLANT
MAT ABSORB  FLUID 56X50 GRAY (MISCELLANEOUS) ×1
MAT ABSORB FLUID 56X50 GRAY (MISCELLANEOUS) ×1 IMPLANT
NDL SAFETY ECLIPSE 18X1.5 (NEEDLE) ×1 IMPLANT
NEEDLE FILTER BLUNT 18X 1/2SAF (NEEDLE)
NEEDLE FILTER BLUNT 18X1 1/2 (NEEDLE) IMPLANT
NEEDLE HYPO 18GX1.5 SHARP (NEEDLE) ×1
NEEDLE HYPO 22GX1.5 SAFETY (NEEDLE) ×2 IMPLANT
NS IRRIG 500ML POUR BTL (IV SOLUTION) ×2 IMPLANT
PACK ARTHROSCOPY KNEE (MISCELLANEOUS) ×2 IMPLANT
PACK EXTREMITY ARMC (MISCELLANEOUS) ×2 IMPLANT
PAD ABD DERMACEA PRESS 5X9 (GAUZE/BANDAGES/DRESSINGS) ×4 IMPLANT
PAD WRAPON POLAR KNEE (MISCELLANEOUS) IMPLANT
SHAVER BLADE BONE CUTTER  5.5 (BLADE)
SHAVER BLADE BONE CUTTER 4.5 (BLADE) ×2 IMPLANT
SHAVER BLADE BONE CUTTER 5.5 (BLADE) IMPLANT
SHAVER BLADE TAPERED BLUNT 4 (BLADE) ×2 IMPLANT
SOL PREP PVP 2OZ (MISCELLANEOUS)
SOLUTION PREP PVP 2OZ (MISCELLANEOUS) IMPLANT
SPONGE T-LAP 18X18 ~~LOC~~+RFID (SPONGE) ×2 IMPLANT
STAPLER SKIN PROX 35W (STAPLE) ×2 IMPLANT
STRIP CLOSURE SKIN 1/2X4 (GAUZE/BANDAGES/DRESSINGS) ×2 IMPLANT
SUT ETHIBOND #5 BRAIDED 30INL (SUTURE) IMPLANT
SUT ETHILON 4-0 (SUTURE) ×1
SUT ETHILON 4-0 FS2 18XMFL BLK (SUTURE) ×1
SUT TICRON 2-0 30IN 311381 (SUTURE) IMPLANT
SUT VIC AB 0 CT1 27 (SUTURE) ×1
SUT VIC AB 0 CT1 27XCR 8 STRN (SUTURE) ×1 IMPLANT
SUT VIC AB 1 CTX 27 (SUTURE) IMPLANT
SUTURE ETHLN 4-0 FS2 18XMF BLK (SUTURE) ×1 IMPLANT
SYR 10ML LL (SYRINGE) ×2 IMPLANT
TAPE MICROFOAM 4IN (TAPE) ×2 IMPLANT
TUBING INFLOW SET DBFLO PUMP (TUBING) ×2 IMPLANT
TUBING OUTFLOW SET DBLFO PUMP (TUBING) ×4 IMPLANT
WAND WEREWOLF FLOW 90D (MISCELLANEOUS) ×2 IMPLANT
WATER STERILE IRR 500ML POUR (IV SOLUTION) ×2 IMPLANT
WRAPON POLAR PAD KNEE (MISCELLANEOUS)

## 2021-06-18 NOTE — Anesthesia Preprocedure Evaluation (Addendum)
Anesthesia Evaluation  Patient identified by MRN, date of birth, ID band Patient awake    Reviewed: Allergy & Precautions, NPO status , Patient's Chart, lab work & pertinent test results  History of Anesthesia Complications Negative for: history of anesthetic complications  Airway Mallampati: IV   Neck ROM: Full    Dental   Missing many molars:   Pulmonary neg pulmonary ROS,    Pulmonary exam normal breath sounds clear to auscultation       Cardiovascular hypertension, + CAD (nonobstructive) and +CHF (cardiomyopathy)  + dysrhythmias (a fib on Eliquis at home, heparin while inpt)  Rhythm:Irregular Rate:Normal  ECG 06/13/21: A fib, LVH, LAD  Echocardiogram 04/2021 EF 30 to 35%  Left heart cath 04/2021, 90% D1, 60% mid LAD, 70% ostial OM 2   Neuro/Psych negative neurological ROS     GI/Hepatic negative GI ROS,   Endo/Other  diabetes, Type 2  Renal/GU negative Renal ROS     Musculoskeletal Septic left knee joint   Abdominal   Peds  Hematology negative hematology ROS (+)   Anesthesia Other Findings Cardiology note 06/13/21:  ASSESSMENT:   1. Coronary artery disease involving native coronary artery of native heart without angina pectoris  2. Cardiomyopathy, unspecified type (HCC)  3. Persistent atrial fibrillation (HCC)  4. Primary hypertension    PLAN:   In order of problems listed above:  1. CAD.  90% D1, 60% mid LAD, 70% ostial OM 2.  Denies chest pain, on Eliquis.  Start Lipitor 40 mg daily. 2. Cardiomyopathy, EF 30 to 35%.  Describes NYHA class II symptoms.  Appears euvolemic, continue Coreg 25 mg twice daily, Aldactone 25.  Increase losartan to 100 mg daily. 3. Persistent A. fib, heart rate controlled.  Continue Coreg.  Advised to take Eliquis 5 mg twice daily.  Patient would like to see family/travel to [place] before performing any additional procedures.  Plan DC cardioversion when patient returns  from trip. 4. Hypertension, BP controlled.  Continue Coreg, losartan, Aldactone as above.  Stop Norvasc, stop chlorthalidone..  Follow-up in 4 months.  Plan DC cardioversion at follow-up visit.   Reproductive/Obstetrics                            Anesthesia Physical Anesthesia Plan  ASA: 3 and emergent  Anesthesia Plan: General   Post-op Pain Management:    Induction: Intravenous  PONV Risk Score and Plan: 3 and Ondansetron and Treatment may vary due to age or medical condition  Airway Management Planned: LMA  Additional Equipment:   Intra-op Plan:   Post-operative Plan: Extubation in OR  Informed Consent: I have reviewed the patients History and Physical, chart, labs and discussed the procedure including the risks, benefits and alternatives for the proposed anesthesia with the patient or authorized representative who has indicated his/her understanding and acceptance.     Dental advisory given (Pt wished to speak in English rather than use interpreter)  Plan Discussed with: CRNA  Anesthesia Plan Comments: (Patient consented for risks of anesthesia including but not limited to:  - adverse reactions to medications - damage to eyes, teeth, lips or other oral mucosa - nerve damage due to positioning  - sore throat or hoarseness - damage to heart, brain, nerves, lungs, other parts of body or loss of life  Informed patient about role of CRNA in peri- and intra-operative care.  Patient voiced understanding.)       Anesthesia Quick Evaluation

## 2021-06-18 NOTE — Progress Notes (Signed)
ORTHOPAEDIC CONSULTATION  REQUESTING PHYSICIAN: Leatha Gilding, MD  Chief Complaint: Left knee pain  HPI: Brooke Miles is a 65 y.o. female who complains of left knee pain and swelling which began 2 days ago.  Patient denies any injury.  Patient is having difficulty weightbearing due to her pain.  Patient's left knee was aspirated by the ED physician yesterday which showed increased cell count and left shift.  She is currently receiving IV antibiotics with vancomycin and cefepime.  Past Medical History:  Diagnosis Date   Diabetes mellitus without complication (HCC)    Hyperlipidemia    Hypertension    Past Surgical History:  Procedure Laterality Date   CESAREAN SECTION     LEFT HEART CATH AND CORONARY ANGIOGRAPHY N/A 05/23/2021   Procedure: LEFT HEART CATH AND CORONARY ANGIOGRAPHY;  Surgeon: Yvonne Kendall, MD;  Location: [facility] INVASIVE CV LAB;  Service: Cardiovascular;  Laterality: N/A;   Social History   Socioeconomic History   Marital status: Married    Spouse name: Not on file   Number of children: Not on file   Years of education: Not on file   Highest education level: Not on file  Occupational History   Not on file  Tobacco Use   Smoking status: Never   Smokeless tobacco: Never  Substance and Sexual Activity   Alcohol use: Not Currently   Drug use: Not on file   Sexual activity: Not on file  Other Topics Concern   Not on file  Social History Narrative   Not on file   Social Determinants of Health   Financial Resource Strain: Not on file  Food Insecurity: Not on file  Transportation Needs: Not on file  Physical Activity: Not on file  Stress: Not on file  Social Connections: Not on file   History reviewed. No pertinent family history. Allergies  Allergen Reactions   Quinolones Hives and Itching   Prior to Admission medications   Medication Sig Start Date End Date Taking? Authorizing Provider  atorvastatin (LIPITOR) 40 MG tablet Take 1 tablet (40  mg total) by mouth daily. 06/13/21 09/11/21 Yes Agbor-Etang, Arlys John, MD  losartan (COZAAR) 100 MG tablet Take 1 tablet (100 mg total) by mouth daily. 06/13/21 06/13/22 Yes Agbor-Etang, Arlys John, MD  metFORMIN (GLUCOPHAGE-XR) 500 MG 24 hr tablet Take 500 mg by mouth 2 (two) times daily.   Yes [provider]  spironolactone (ALDACTONE) 25 MG tablet Take 1 tablet (25 mg total) by mouth once daily. 06/13/21  Yes Agbor-Etang, Arlys John, MD  apixaban (ELIQUIS) 5 MG TABS tablet Take 1 tablet (5 mg total) by mouth 2 (two) times daily. 06/13/21   Debbe Odea, MD  apixaban (ELIQUIS) 5 MG TABS tablet Take 1 tablet (5 mg total) by mouth 2 (two) times daily. Patient not taking: Reported on 06/17/2021 06/13/21   Debbe Odea, MD  carvedilol (COREG) 25 MG tablet Take 1 tablet (25 mg total) by mouth 2 (two) times daily with a meal. 04/28/21   Danford, Earl Lites, MD  diclofenac Sodium (VOLTAREN) 1 % GEL Apply 4 g topically 4 (four) times daily as needed for pain. Patient not taking: Reported on 06/17/2021    [provider]   DG Chest Portable 1 View  Result Date: 06/17/2021 CLINICAL DATA:  Lactic acidosis EXAM: PORTABLE CHEST 1 VIEW COMPARISON:  None. FINDINGS: The heart size and mediastinal contours are within normal limits. Both lungs are clear. The visualized skeletal structures are unremarkable. IMPRESSION: No active disease. Electronically Signed  By: Sharlet Salina M.D.   On: 06/17/2021 18:30   DG Knee Complete 4 Views Left  Result Date: 06/17/2021 CLINICAL DATA:  Left knee pain and swelling EXAM: LEFT KNEE - COMPLETE 4+ VIEW COMPARISON:  None. FINDINGS: No acute fracture or dislocation. Tricompartmental joint space narrowing and marginal osteophytes. Moderate to large supra patellar joint effusion. Calcification on the medial aspect of the medial femoral condyle, likely sequela of prior ligamentous injury. IMPRESSION: 1.  No acute fracture or dislocation. 2. Moderate  tricompartmental osteoarthritis with moderate to large suprapatellar joint effusion. Electronically Signed   By: Larose Hires D.O.   On: 06/17/2021 14:11    Positive ROS: All other systems have been reviewed and were otherwise negative with the exception of those mentioned in the HPI and as above.  Physical Exam: General: Alert, no acute distress  MUSCULOSKELETAL: Left knee: Patient has a left knee effusion.  Is tender globally to palpation.  Active and passive knee range of motion cause pain.  Distally patient is neurovascular intact.  Her compartments are soft and compressible.  Assessment: Septic left knee joint  Plan: Patient has an exam consistent with a septic left native knee joint.  Her aspiration has demonstrated a high cell count and left shift.  I am recommending arthroscopic I&D of the left knee today.  Patient will remain n.p.o.  The OR is targeting approximately 3 PM for her surgery.  I discussed the risks and benefits of surgery. The risks include but are not limited to recurrent infection, bleeding, nerve or blood vessel injury, joint stiffness or loss of motion, persistent pain, weakness or instability,and the need for further surgery.  Patient understood these risks and wished to proceed.    Juanell Fairly, MD    06/18/2021 9:19 AM

## 2021-06-18 NOTE — Progress Notes (Signed)
Inpatient Diabetes Program Recommendations  AACE/ADA: New Consensus Statement on Inpatient Glycemic Control   Target Ranges:  Prepandial:   less than 140 mg/dL      Peak postprandial:   less than 180 mg/dL (1-2 hours)      Critically ill patients:  140 - 180 mg/dL    Latest Reference Range & Units 06/17/21 21:16 06/18/21 08:10  Glucose-Capillary 70 - 99 mg/dL 532 (H) 992 (H)  (H): Data is abnormally high  Review of Glycemic Control  Diabetes history: DM2 Outpatient Diabetes medications: Metformin XR 500 mg BID Current orders for Inpatient glycemic control: Novolog 0-15 units TID with meals, Novolog 0-5 units QHS  Inpatient Diabetes Program Recommendations:    Insulin: If glucose remains consistently over 180 mg/dl, please consider ordering Levemir 8 units Q24H.  Thanks, Orlando Penner, RN, MSN, CDE Diabetes Coordinator Inpatient Diabetes Program [phone] (Team Pager from 8am to 5pm)

## 2021-06-18 NOTE — TOC Progression Note (Signed)
Transition of Care [facility]) - Progression Note    Patient Details  Name: Brooke Miles MRN: [id number] Date of Birth: 04/19/1956  Transition of [facility]) CM/SW Contact  Marlowe Sax, RN Phone Number: 06/18/2021, 9:31 AM  Clinical Narrative:   The patient comes from Home, Has family support, OR scheduled for today around 3 to get wash out and I &D,   Patient's left knee was aspirated by the ED physician Monday which showed increased cell count and left shift.  She is currently receiving IV antibiotics with vancomycin and cefepime.  She is on Eliquis at home  [facility] to continue to follow for Discharge planning and Needs         Expected Discharge Plan and Services                                                 Social Determinants of Health (SDOH) Interventions    Readmission Risk Interventions No flowsheet data found.

## 2021-06-18 NOTE — Consult Note (Signed)
Pharmacy Antibiotic Note  Grayson Rhea Kaelin is a 65 y.o. female admitted on 06/17/2021 with  septic joint .  Pharmacy has been consulted for vancomycin and cefepime dosing.  Plan:  Cefepime 2 g IV q8h  Patient only received 1 g IV LD of Vancomycin, therefore, will move up maintenance dose of of vancomycin 2g IV q24h to be given this afternoon rather than this evening --Calculated AUC: 502.1, Cmin 10.0 --Daily Scr per protocol --Levels at steady state as clinically indicated  Height: 5\' 8"  (172.7 cm) Weight: 86.6 kg (191 lb) IBW/kg (Calculated) : 63.9  Temp (24hrs), Avg:98.6 F (37 C), Min:98.1 F (36.7 C), Max:99.3 F (37.4 C)  Recent Labs  Lab 06/17/21 1304 06/17/21 2156 06/18/21 0330  WBC 9.8  --  8.8  CREATININE 0.93  --  0.73  LATICACIDVEN 4.8* 2.7*  --      Estimated Creatinine Clearance: 81.9 mL/min (by C-G formula based on SCr of 0.73 mg/dL).    Allergies  Allergen Reactions   Quinolones Hives and Itching    Antimicrobials this admission: Vancomycin 11/22 >>  Cefepime 11/22 >>   Dose adjustments this admission: N/A  Microbiology results: 11/22 BCx: NGTD 11/22 L knee synovial culture: pending 11/22 Chlamydia / gonococcal PCR: not detected  Thank you for allowing pharmacy to be a part of this patient's care.  12/22 A Donnajean Chesnut 06/18/2021 2:27 PM

## 2021-06-18 NOTE — Transfer of Care (Signed)
Immediate Anesthesia Transfer of Care Note  Patient: Brooke Miles  Procedure(s) Performed: ARTHROSCOPY KNEE (Left: Knee) INCISION AND DRAINAGE (Left: Knee)  Patient Location: PACU  Anesthesia Type:General  Level of Consciousness: drowsy  Airway & Oxygen Therapy: Patient Spontanous Breathing and Patient connected to face mask oxygen  Post-op Assessment: Report given to RN and Post -op Vital signs reviewed and stable  Post vital signs: Reviewed and stable  Last Vitals:  Vitals Value Taken Time  BP 123/61 06/18/21 1808  Temp 36.6 C 06/18/21 1808  Pulse 86 06/18/21 1808  Resp 14 06/18/21 1808  SpO2 100 % 06/18/21 1808    Last Pain:  Vitals:   06/18/21 1808  TempSrc: Oral  PainSc:          Complications: No notable events documented.

## 2021-06-18 NOTE — Progress Notes (Signed)
Initial Nutrition Assessment  DOCUMENTATION CODES:   Not applicable  INTERVENTION:   -Once diet is advanced, add:   -Glucerna Shake po TID, each supplement provides 220 kcal and 10 grams of protein  -MVI with minerals daily  NUTRITION DIAGNOSIS:   Increased nutrient needs related to post-op healing as evidenced by estimated needs.  GOAL:   Patient will meet greater than or equal to 90% of their needs  MONITOR:   PO intake, Supplement acceptance, Diet advancement, Labs, Weight trends, Skin, I & O's  REASON FOR ASSESSMENT:   Malnutrition Screening Tool    ASSESSMENT:   Brooke Miles is a 65 y.o. female with medical history significant for hyperlipidemia, non-insulin-dependent diabetes mellitus, hypertension, on anticoagulation with Eliquis, atrial fibrillation, who presents emergency department for chief concerns of left knee pain.  Pt admitted with septic joint.   Reviewed I/O's: +408 ml x 24 hours  UOP: 700 ml x 24 hours  Per orthopedics notes, plan for I&D of lt knee today. Pt currently NPO for procedure.    Spoke with pt at bedside. Pt able to understand this RD. Offered to use interpreter with pt, however, she politely declined. Pt reports good appetite PTA. She consumes 3 meals per day, which she reports are usually meat and vegetables. She denies any changes in the quantity of food eaten or what she is eating. She lives with her family, who prepares meals and she states they all eat their meals together.   Pt endorses wt loss over the past year "since I've gotten sick". Pt unsure of UBW of how much weight she has lost. Reviewed wt hx; pt has experienced a 13.2% wt loss over the past 2 months, which is significant for time frame.   Discussed importance of good meal and supplement intake to promote healing. Pt amenable to supplements.   Medications reviewed and include aldactone.   Lab Results  Component Value Date   HGBA1C 8.1 (H) 04/25/2021  PTA DM  medications are 500 mg metformin XR BID.   Labs reviewed: Na: 131, K: 3.3, CBGS: 199-294 (inpatient orders for glycemic control are 0-15 units insulin aspart TID and  0-5 units insulin aspart daily at bedtime).    NUTRITION - FOCUSED PHYSICAL EXAM:  Flowsheet Row Most Recent Value  Orbital Region No depletion  Upper Arm Region No depletion  Thoracic and Lumbar Region No depletion  Buccal Region No depletion  Temple Region No depletion  Clavicle Bone Region No depletion  Clavicle and Acromion Bone Region No depletion  Scapular Bone Region No depletion  Dorsal Hand No depletion  Patellar Region No depletion  Anterior Thigh Region No depletion  Posterior Calf Region No depletion  Edema (RD Assessment) None  Hair Reviewed  Eyes Reviewed  Mouth Reviewed  Skin Reviewed  Nails Reviewed       Diet Order:   Diet Order             Diet NPO time specified Except for: Ice Chips, Sips with Meds  Diet effective midnight                   EDUCATION NEEDS:   Education needs have been addressed  Skin:  Skin Assessment: Reviewed RN Assessment  Last BM:  06/18/21  Height:   Ht Readings from Last 1 Encounters:  06/17/21 5\' 8"  (1.727 m)    Weight:   Wt Readings from Last 1 Encounters:  06/17/21 86.6 kg    Ideal Body Weight:  63.6 kg  BMI:  Body mass index is 29.04 kg/m.  Estimated Nutritional Needs:   Kcal:  1900-2100  Protein:  95-110 grams  Fluid:  > 1.9 L    Loistine Chance, RD, LDN, [place] Registered Dietitian II Certified Diabetes Care and Education Specialist Please refer to [facility] for RD and/or RD on-call/weekend/after hours pager

## 2021-06-18 NOTE — Progress Notes (Addendum)
PROGRESS NOTE  Brooke Miles NLG:[id number] DOB: 11/28/1955 DOA: 06/17/2021 PCP: [facility]   LOS: 0 days   Brief Narrative / Interim history: 65 year old female with history of HLD, NIDDM, HTN, A. fib on Eliquis who comes into the hospital for left knee pain.  Started about 3 days ago, no trauma, also reports subjective fevers.  This is never happened to her before.  Fluid drawn in the ED shows a WBC of 46K with 98% neutrophils.  Orthopedic surgery consulted  Subjective / 24h Interval events: Still complains of left knee pain.  No abdominal pain, no chest pain, no nausea or vomiting  Assessment & Plan: Principal Problem Concern for septic joint-gram stain was negative but had significant WBC elevation with neutrophil shift.  Orthopedic surgery will take her to the OR this afternoon -Continue broad-spectrum antibiotics, ID consulted as well  Active Problems NIDDM-continue sliding scale  CBG (last 3)  Recent Labs    06/17/21 2116 06/18/21 0810  GLUCAP 294* 200*    Essential hypertension-continue losartan, spironolactone, Coreg  Hyperlipidemia-continue statin  Chronic systolic CHF, CAD-continue medications as below, she appears euvolemic.  Most recent 2D echo done October 2022 shows EF 30-35% with global hypokinesis.  She underwent a cath in October 2022 which showed moderate severe noncritical CAD.  It was felt like her LVEF is out of proportion to the CAD, suggesting nonischemic cardiomyopathy  Persistent A. fib-continue Coreg, anticoagulated with Eliquis which is now on hold.  Resume per Ortho.  Hypokalemia-replete and monitor  Hyponatremia-mild, stable, monitor.  There is some degree of chronicity to it  Scheduled Meds:  atorvastatin  40 mg Oral QHS   carvedilol  25 mg Oral BID WC   insulin aspart  0-15 Units Subcutaneous TID WC   insulin aspart  0-5 Units Subcutaneous QHS   losartan  100 mg Oral Daily   spironolactone  25 mg Oral  Daily   Continuous Infusions:  sodium chloride 10 mL/hr at 06/17/21 2110    ceFAZolin (ANCEF) IV     ceFEPime (MAXIPIME) IV 2 g (06/17/21 2115)   vancomycin     vancomycin     PRN Meds:.sodium chloride, acetaminophen **OR** acetaminophen, ondansetron **OR** ondansetron (ZOFRAN) IV  Diet Orders (From admission, onward)     Start     Ordered   06/18/21 0001  Diet NPO time specified Except for: Ice Chips, Sips with Meds  Diet effective midnight       Question Answer Comment  Except for Ice Chips   Except for Sips with Meds      06/17/21 1900            DVT prophylaxis: Place TED hose Start: 06/17/21 1900     Code Status: Full Code  Family Communication: No family at bedside  Status is: Observation  The patient will require care spanning > 2 midnights and should be moved to inpatient because: Antibiotics, will be taken to the OR for washout   Level of care: Med-Surg  Consultants:  Orthopedic surgery Infectious disease  Procedures:  none  Microbiology  Blood cultures 11/22-no growth to date Knee aspirate fluid culture 11/22-no growth to date  Antimicrobials: Comycin, cefepime 11/23   Objective: Vitals:   06/17/21 1900 06/17/21 2036 06/18/21 0352 06/18/21 0809  BP: (!) 147/80 138/84 (!) 118/58 129/80  Pulse: 91 93 88 91  Resp: 15 18 17 15   Temp: 98.6 F (37 C) 98.7 F (37.1 C) 98.1 F (36.7 C) 98.6 F (37  C)  TempSrc: Oral     SpO2: 100% 99% 100% 100%  Weight:      Height:        Intake/Output Summary (Last 24 hours) at 06/18/2021 1107 Last data filed at 06/18/2021 0400 Gross per 24 hour  Intake 1108 ml  Output 700 ml  Net 408 ml   Filed Weights   06/17/21 1302  Weight: 86.6 kg    Examination:  Constitutional: NAD Eyes: no scleral icterus ENMT: Mucous membranes are moist.  Neck: normal, supple Respiratory: clear to auscultation bilaterally, no wheezing, no crackles. Normal respiratory effort.  Cardiovascular: Regular rate and  rhythm, no murmurs / rubs / gallops. No LE edema.  Abdomen: non distended, no tenderness. Bowel sounds positive.  Musculoskeletal: no clubbing / cyanosis.  Swollen and warm knee Skin: no rashes Neurologic: CN 2-12 grossly intact. Strength 5/5 in all 4.   Data Reviewed: I have independently reviewed following labs and imaging studies   CBC: Recent Labs  Lab 06/17/21 1304 06/18/21 0330  WBC 9.8 8.8  NEUTROABS 7.5  --   HGB 13.6 12.1  HCT 39.3 34.8*  MCV 83.8 81.7  PLT 279 233   Basic Metabolic Panel: Recent Labs  Lab 06/17/21 1304 06/18/21 0330  NA 129* 131*  K 3.2* 3.3*  CL 93* 97*  CO2 25 27  GLUCOSE 333* 208*  BUN 14 12  CREATININE 0.93 0.73  CALCIUM 9.7 9.3  MG 1.3*  --    Liver Function Tests: Recent Labs  Lab 06/17/21 1304  AST 27  ALT 12  ALKPHOS 46  BILITOT 0.9  PROT 7.7  ALBUMIN 3.9   Coagulation Profile: No results for input(s): INR, PROTIME in the last 168 hours. HbA1C: No results for input(s): HGBA1C in the last 72 hours. CBG: Recent Labs  Lab 06/17/21 2116 06/18/21 0810  GLUCAP 294* 200*    Recent Results (from the past 240 hour(s))  Chlamydia/NGC rt PCR ([facility] only)     Status: None   Collection Time: 06/17/21  3:03 PM   Specimen: Urine  Result Value Ref Range Status   Specimen source GC/Chlam URINE, RANDOM  Final   Chlamydia Tr NOT DETECTED NOT DETECTED Final   N gonorrhoeae NOT DETECTED NOT DETECTED Final    Comment: (NOTE) This CT/NG assay has not been evaluated in patients with a history of  hysterectomy. Performed at [facility], [address] [place], [place] [address]   Body fluid culture w Gram Stain     Status: None (Preliminary result)   Collection Time: 06/17/21  3:46 PM   Specimen: KNEE; Body Fluid  Result Value Ref Range Status   Specimen Description   Final    KNEE Performed at [facility], [address], [place], [place] [address]    Special Requests   Final    NONE Performed at  [facility], [address] [place], [place] [address]    Gram Stain   Final    FEW WBC PRESENT, PREDOMINANTLY MONONUCLEAR NO ORGANISMS SEEN    Culture   Final    NO GROWTH < 12 HOURS Performed at [facility], [address], [place], [place] [address]    Report Status PENDING  Incomplete  Resp Panel by RT-PCR (Flu A&B, Covid) Nasopharyngeal Swab     Status: None   Collection Time: 06/17/21  6:08 PM   Specimen: Nasopharyngeal Swab; Nasopharyngeal(NP) swabs in vial transport medium  Result Value Ref Range Status  SARS Coronavirus 2 by RT PCR NEGATIVE NEGATIVE Final    Comment: (NOTE) SARS-CoV-2 target nucleic acids are NOT DETECTED.  The SARS-CoV-2 RNA is generally detectable in upper respiratory specimens during the acute phase of infection. The lowest concentration of SARS-CoV-2 viral copies this assay can detect is 138 copies/mL. A negative result does not preclude SARS-Cov-2 infection and should not be used as the sole basis for treatment or other patient management decisions. A negative result may occur with  improper specimen collection/handling, submission of specimen other than nasopharyngeal swab, presence of viral mutation(s) within the areas targeted by this assay, and inadequate number of viral copies(<138 copies/mL). A negative result must be combined with clinical observations, patient history, and epidemiological information. The expected result is Negative.  Fact Sheet for Patients:  BloggerCourse.com  Fact Sheet for Healthcare Providers:  SeriousBroker.it  This test is no t yet approved or cleared by the [place] FDA and  has been authorized for detection and/or diagnosis of SARS-CoV-2 by FDA under an Emergency Use Authorization (EUA). This EUA will remain  in effect (meaning this test can be used) for the duration of the COVID-19 declaration under Section 564(b)(1) of the Act,  21 U.S.C.section 360bbb-3(b)(1), unless the authorization is terminated  or revoked sooner.       Influenza A by PCR NEGATIVE NEGATIVE Final   Influenza B by PCR NEGATIVE NEGATIVE Final    Comment: (NOTE) The Xpert Xpress SARS-CoV-2/FLU/RSV plus assay is intended as an aid in the diagnosis of influenza from Nasopharyngeal swab specimens and should not be used as a sole basis for treatment. Nasal washings and aspirates are unacceptable for Xpert Xpress SARS-CoV-2/FLU/RSV testing.  Fact Sheet for Patients: BloggerCourse.com  Fact Sheet for Healthcare Providers: SeriousBroker.it  This test is not yet approved or cleared by the [place] FDA and has been authorized for detection and/or diagnosis of SARS-CoV-2 by FDA under an Emergency Use Authorization (EUA). This EUA will remain in effect (meaning this test can be used) for the duration of the COVID-19 declaration under Section 564(b)(1) of the Act, 21 U.S.C. section 360bbb-3(b)(1), unless the authorization is terminated or revoked.  Performed at [facility], [address] [place], [place] [address]   Blood culture (routine x 2)     Status: None (Preliminary result)   Collection Time: 06/17/21  6:41 PM   Specimen: BLOOD  Result Value Ref Range Status   Specimen Description BLOOD BLOOD RIGHT HAND  Final   Special Requests   Final    BOTTLES DRAWN AEROBIC ONLY Blood Culture results may not be optimal due to an inadequate volume of blood received in culture bottles   Culture   Final    NO GROWTH < 24 HOURS Performed at [facility], [address] [place], [place] [address]    Report Status PENDING  Incomplete  Blood culture (routine x 2)     Status: None (Preliminary result)   Collection Time: 06/17/21  9:48 PM   Specimen: BLOOD  Result Value Ref Range Status   Specimen Description BLOOD BLOOD LEFT HAND  Final   Special Requests   Final     BOTTLES DRAWN AEROBIC AND ANAEROBIC Blood Culture adequate volume   Culture   Final    NO GROWTH < 12 HOURS Performed at [facility], [address] [place], [place] [address]    Report Status PENDING  Incomplete     Radiology Studies: DG Chest Portable 1 View  Result Date:  06/17/2021 CLINICAL DATA:  Lactic acidosis EXAM: PORTABLE CHEST 1 VIEW COMPARISON:  None. FINDINGS: The heart size and mediastinal contours are within normal limits. Both lungs are clear. The visualized skeletal structures are unremarkable. IMPRESSION: No active disease. Electronically Signed   By: Sharlet Salina M.D.   On: 06/17/2021 18:30   DG Knee Complete 4 Views Left  Result Date: 06/17/2021 CLINICAL DATA:  Left knee pain and swelling EXAM: LEFT KNEE - COMPLETE 4+ VIEW COMPARISON:  None. FINDINGS: No acute fracture or dislocation. Tricompartmental joint space narrowing and marginal osteophytes. Moderate to large supra patellar joint effusion. Calcification on the medial aspect of the medial femoral condyle, likely sequela of prior ligamentous injury. IMPRESSION: 1.  No acute fracture or dislocation. 2. 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 Electronically Signed   By: Larose Hires D.O.   On: 06/17/2021 14:11    Pamella Pert, MD, PhD [facility]  Between 7 am - 7 pm I am available, please contact me via Amion (for emergencies) or Securechat (non urgent messages)  Between 7 pm - 7 am I am not available, please contact night coverage MD/APP via Amion

## 2021-06-18 NOTE — Consult Note (Signed)
NAME: Brooke Miles  DOB: Nov 17, 1955  MRN: [id number]  Date/Time: 06/18/2021 12:48 PM  REQUESTING PROVIDER: Dr. Renne Crigler Subjective:  REASON FOR CONSULT: left knee septic arthritis ? Brooke Miles is a 65 y.o. female with a history of DM, HTN , CHF, Afib from [place] been in [place] for 4 months Presents with acute onset left knee pain for the past 3 days Pt denies any trauma, tick bite, animal bite, sexual activity. Did not have any skin lesions Says she thinks she may have had fever No past h/o TB or similar episode H/o csection In the ED on 11/22 temp 98.7, Pulse 93, BP 138/84, sats 99% WBC 9.8, HB 13.6, PLT 279 , K 3.2, uric acid 7.3 HIV NR GC/CHL NR Knee aspiration showed 46, 445 WBC - 98% No crystals  Past Medical History:  Diagnosis Date   Diabetes mellitus without complication ([place])    Hyperlipidemia    Hypertension     Past Surgical History:  Procedure Laterality Date   CESAREAN SECTION     LEFT HEART CATH AND CORONARY ANGIOGRAPHY N/A 05/23/2021   Procedure: LEFT HEART CATH AND CORONARY ANGIOGRAPHY;  Surgeon: Nelva Bush, MD;  Location: [facility];  Service: Cardiovascular;  Laterality: N/A;    Social History   Socioeconomic History   Marital status: Married    Spouse name: Not on file   Number of children: Not on file   Years of education: Not on file   Highest education level: Not on file  Occupational History   Not on file  Tobacco Use   Smoking status: Never   Smokeless tobacco: Never  Substance and Sexual Activity   Alcohol use: Not Currently   Drug use: Not on file   Sexual activity: Not on file  Other Topics Concern   Not on file  Social History Narrative   Not on file   Social Determinants of Health   Financial Resource Strain: Not on file  Food Insecurity: Not on file  Transportation Needs: Not on file  Physical Activity: Not on file  Stress: Not on file  Social Connections: Not on file  Intimate Partner Violence: Not on  file    History reviewed. No pertinent family history. Allergies  Allergen Reactions   Quinolones Hives and Itching   I? Current Facility-Administered Medications  Medication Dose Route Frequency Provider Last Rate Last Admin   0.9 %  sodium chloride infusion   Intravenous PRN Cox, Amy N, DO 10 mL/hr at 06/17/21 2110 New Bag at 06/17/21 2110   acetaminophen (TYLENOL) tablet 1,000 mg  1,000 mg Oral Q6H PRN Cox, Amy N, DO       Or   acetaminophen (TYLENOL) suppository 650 mg  650 mg Rectal Q6H PRN Cox, Amy N, DO       atorvastatin (LIPITOR) tablet 40 mg  40 mg Oral QHS Cox, Amy N, DO   40 mg at 06/17/21 2110   carvedilol (COREG) tablet 25 mg  25 mg Oral BID WC Cox, Amy N, DO   25 mg at 06/18/21 [id number]   ceFAZolin (ANCEF) IVPB 2g/100 mL premix  2 g Intravenous 30 min Pre-Op [place], MD       ceFEPIme (MAXIPIME) 2 g in sodium chloride 0.9 % 100 mL IVPB  2 g Intravenous Q8H Lockie Mola B, RPH 200 mL/hr at 06/17/21 2115 2 g at 06/17/21 2115   insulin aspart (novoLOG) injection 0-15 Units  0-15 Units Subcutaneous TID WC Cox, Amy N,  DO   3 Units at 06/18/21 2202   insulin aspart (novoLOG) injection 0-5 Units  0-5 Units Subcutaneous QHS Cox, Amy N, DO   3 Units at 06/17/21 2119   losartan (COZAAR) tablet 100 mg  100 mg Oral Daily Cox, Amy N, DO   100 mg at 06/18/21 0828   ondansetron (ZOFRAN) tablet 4 mg  4 mg Oral Q6H PRN Cox, Amy N, DO       Or   ondansetron (ZOFRAN) injection 4 mg  4 mg Intravenous Q6H PRN Cox, Amy N, DO       spironolactone (ALDACTONE) tablet 25 mg  25 mg Oral Daily Cox, Amy N, DO   25 mg at 06/18/21 5427   vancomycin (VANCOCIN) IVPB 1000 mg/200 mL premix  1,000 mg Intravenous Once Tressie Ellis, RPH       vancomycin (VANCOREADY) IVPB 1750 mg/350 mL  1,750 mg Intravenous Q24H Dorothea Ogle B, RPH         Abtx:  Anti-infectives (From admission, onward)    Start     Dose/Rate Route Frequency Ordered Stop   06/18/21 2200  vancomycin (VANCOREADY) IVPB 1750  mg/350 mL        1,750 mg 175 mL/hr over 120 Minutes Intravenous Every 24 hours 06/17/21 2023     06/18/21 0923  ceFAZolin (ANCEF) IVPB 2g/100 mL premix        2 g 200 mL/hr over 30 Minutes Intravenous 30 min pre-op 06/18/21 0923     06/17/21 2200  ceFEPIme (MAXIPIME) 2 g in sodium chloride 0.9 % 100 mL IVPB        2 g 200 mL/hr over 30 Minutes Intravenous Every 8 hours 06/17/21 2020     06/17/21 2030  vancomycin (VANCOCIN) IVPB 1000 mg/200 mL premix        1,000 mg 200 mL/hr over 60 Minutes Intravenous  Once 06/17/21 2022     06/17/21 1800  vancomycin (VANCOCIN) IVPB 1000 mg/200 mL premix        1,000 mg 200 mL/hr over 60 Minutes Intravenous  Once 06/17/21 1750 06/17/21 1921       REVIEW OF SYSTEMS:  Const: negative fever, negative chills, negative weight loss Eyes: negative diplopia or visual changes, negative eye pain ENT: negative coryza, negative sore throat Resp: negative cough, hemoptysis, dyspnea Cards: negative for chest pain, palpitations, lower extremity edema GU: negative for frequency, dysuria and hematuria GI: Negative for abdominal pain, diarrhea, bleeding, constipation Skin: negative for rash and pruritus Heme: negative for easy bruising and gum/nose bleeding MS: negative for myalgias, arthralgias, back pain and muscle weakness Neurolo:negative for headaches, dizziness, vertigo, memory problems  Psych: negative for feelings of anxiety, depression  Endocrine:  diabetes Allergy/Immunology-quinolone ?  Objective:  VITALS:  BP 123/87 (BP Location: Right Arm)   Pulse 88   Temp 98.2 F (36.8 C)   Resp 14   Ht 5\' 8"  (1.727 m)   Wt 86.6 kg   SpO2 99%   BMI 29.04 kg/m  PHYSICAL EXAM:  General: Alert, cooperative, no distress, appears stated age.  Head: Normocephalic, without obvious abnormality, atraumatic. Eyes: Conjunctivae clear, anicteric sclerae. Pupils are equal ENT Nares normal. No drainage or sinus tenderness. Lips, mucosa, and tongue normal. No  Thrush Neck: Supple, symmetrical, no adenopathy, thyroid: non tender no carotid bruit and no JVD. Back: No CVA tenderness. Lungs: Clear to auscultation bilaterally. No Wheezing or Rhonchi. No rales. Heart: Regular rate and rhythm, no murmur, rub or gallop. Abdomen: Soft, non-tender,not  distended. Bowel sounds normal. No masses Extremities:left knee swollen, tender and warm- restricted mobility Skin: No rashes or lesions. Or bruising Lymph: Cervical, supraclavicular normal. Neurologic: Grossly non-focal Pertinent Labs Lab Results CBC    Component Value Date/Time   WBC 8.8 06/18/2021 0330   RBC 4.26 06/18/2021 0330   HGB 12.1 06/18/2021 0330   HCT 34.8 (L) 06/18/2021 0330   PLT 233 06/18/2021 0330   MCV 81.7 06/18/2021 0330   MCH 28.4 06/18/2021 0330   MCHC 34.8 06/18/2021 0330   RDW 13.5 06/18/2021 0330   LYMPHSABS 1.8 06/17/2021 1304   MONOABS 0.5 06/17/2021 1304   EOSABS 0.0 06/17/2021 1304   BASOSABS 0.0 06/17/2021 1304    CMP Latest Ref Rng & Units 06/18/2021 06/17/2021 05/23/2021  Glucose 70 - 99 mg/dL 208(H) 333(H) 170(H)  BUN 8 - 23 mg/dL 12 14 21   Creatinine 0.44 - 1.00 mg/dL 0.73 0.93 0.98  Sodium 135 - 145 mmol/L 131(L) 129(L) 135  Potassium 3.5 - 5.1 mmol/L 3.3(L) 3.2(L) 4.3  Chloride 98 - 111 mmol/L 97(L) 93(L) 103  CO2 22 - 32 mmol/L 27 25 25   Calcium 8.9 - 10.3 mg/dL 9.3 9.7 10.9(H)  Total Protein 6.5 - 8.1 g/dL - 7.7 -  Total Bilirubin 0.3 - 1.2 mg/dL - 0.9 -  Alkaline Phos 38 - 126 U/L - 46 -  AST 15 - 41 U/L - 27 -  ALT 0 - 44 U/L - 12 -      Microbiology: Recent Results (from the past 240 hour(s))  Chlamydia/NGC rt PCR ([facility] only)     Status: None   Collection Time: 06/17/21  3:03 PM   Specimen: Urine  Result Value Ref Range Status   Specimen source GC/Chlam URINE, RANDOM  Final   Chlamydia Tr NOT DETECTED NOT DETECTED Final   N gonorrhoeae NOT DETECTED NOT DETECTED Final    Comment: (NOTE) This CT/NG assay has not been evaluated in patients  with a history of  hysterectomy. Performed at [facility], [address] [place], [place] [address]   Body fluid culture w Gram Stain     Status: None (Preliminary result)   Collection Time: 06/17/21  3:46 PM   Specimen: KNEE; Body Fluid  Result Value Ref Range Status   Specimen Description   Final    KNEE Performed at [facility], [address], [place], [place] [address]    Special Requests   Final    NONE Performed at [facility], [address] [place], [place] [address]    Gram Stain   Final    FEW WBC PRESENT, PREDOMINANTLY MONONUCLEAR NO ORGANISMS SEEN    Culture   Final    NO GROWTH < 12 HOURS Performed at [facility], [place] [address] [place], [place] [address]    Report Status PENDING  Incomplete  Resp Panel by RT-PCR (Flu A&B, Covid) Nasopharyngeal Swab     Status: None   Collection Time: 06/17/21  6:08 PM   Specimen: Nasopharyngeal Swab; Nasopharyngeal(NP) swabs in vial transport medium  Result Value Ref Range Status   SARS Coronavirus 2 by RT PCR NEGATIVE NEGATIVE Final    Comment: (NOTE) SARS-CoV-2 target nucleic acids are NOT DETECTED.  The SARS-CoV-2 RNA is generally detectable in upper respiratory specimens during the acute phase of infection. The lowest concentration of SARS-CoV-2 viral copies this assay can detect is 138 copies/mL. A negative result does not preclude SARS-Cov-2 infection and should not be used as  the sole basis for treatment or other patient management decisions. A negative result may occur with  improper specimen collection/handling, submission of specimen other than nasopharyngeal swab, presence of viral mutation(s) within the areas targeted by this assay, and inadequate number of viral copies(<138 copies/mL). A negative result must be combined with clinical observations, patient history, and epidemiological information. The expected result is Negative.  Fact Sheet for Patients:   EntrepreneurPulse.com.au  Fact Sheet for Healthcare Providers:  IncredibleEmployment.be  This test is no t yet approved or cleared by the [place] FDA and  has been authorized for detection and/or diagnosis of SARS-CoV-2 by FDA under an Emergency Use Authorization (EUA). This EUA will remain  in effect (meaning this test can be used) for the duration of the COVID-19 declaration under Section 564(b)(1) of the Act, 21 U.S.C.section 360bbb-3(b)(1), unless the authorization is terminated  or revoked sooner.       Influenza A by PCR NEGATIVE NEGATIVE Final   Influenza B by PCR NEGATIVE NEGATIVE Final    Comment: (NOTE) The Xpert Xpress SARS-CoV-2/FLU/RSV plus assay is intended as an aid in the diagnosis of influenza from Nasopharyngeal swab specimens and should not be used as a sole basis for treatment. Nasal washings and aspirates are unacceptable for Xpert Xpress SARS-CoV-2/FLU/RSV testing.  Fact Sheet for Patients: EntrepreneurPulse.com.au  Fact Sheet for Healthcare Providers: IncredibleEmployment.be  This test is not yet approved or cleared by the [place] FDA and has been authorized for detection and/or diagnosis of SARS-CoV-2 by FDA under an Emergency Use Authorization (EUA). This EUA will remain in effect (meaning this test can be used) for the duration of the COVID-19 declaration under Section 564(b)(1) of the Act, 21 U.S.C. section 360bbb-3(b)(1), unless the authorization is terminated or revoked.  Performed at [facility], [address] [place], [place] [address]   Blood culture (routine x 2)     Status: None (Preliminary result)   Collection Time: 06/17/21  6:41 PM   Specimen: BLOOD  Result Value Ref Range Status   Specimen Description BLOOD BLOOD RIGHT HAND  Final   Special Requests   Final    BOTTLES DRAWN AEROBIC ONLY Blood Culture results may not be optimal due to an  inadequate volume of blood received in culture bottles   Culture   Final    NO GROWTH < 24 HOURS Performed at [facility], [address] [place], [place] [address]    Report Status PENDING  Incomplete  Blood culture (routine x 2)     Status: None (Preliminary result)   Collection Time: 06/17/21  9:48 PM   Specimen: BLOOD  Result Value Ref Range Status   Specimen Description BLOOD BLOOD LEFT HAND  Final   Special Requests   Final    BOTTLES DRAWN AEROBIC AND ANAEROBIC Blood Culture adequate volume   Culture   Final    NO GROWTH < 12 HOURS Performed at [facility], [address] [place], [place] [address]    Report Status PENDING  Incomplete    IMAGING RESULTS: Xray left knee  No acute fracture or dislocation.  2. 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 CXR NR I have personally reviewed the films ? Impression/Recommendation ? 65 yr female presenting with acute onset left knee swelling , pain  Acute inflammatory arthritis left knee- synovial fluid neutrophilic pleocytosis- septic arthirtis need to be ruled out No evidence of Tick bite, or STDs Agree with vanco- cefepime could be changed  to ceftriaxone Pt is going for arthroscopy washout- will follow culture results Uric acid slightly elevated at 7.3, but no crystals seen in synovial fluid ? ?DM- on insulin HTN CHF- EF 30-35% from 04/26/21- The left ventricle had moderately decreased function. The left ventricle had global hypokinesis. Pt on carvedilol, losartan, aldactone- followed by Cards as OP  Afib- HR controlled on eliquis 5 mg BID  ___________________________________________________ Discussed with patient, requesting provider Note:  This document was prepared using Dragon voice recognition software and may include unintentional dictation errors.

## 2021-06-18 NOTE — Anesthesia Procedure Notes (Signed)
Procedure Name: LMA Insertion Date/Time: 06/18/2021 3:15 PM Performed by: Hezzie Bump, CRNA Pre-anesthesia Checklist: Patient identified, Patient being monitored, Timeout performed, Emergency Drugs available and Suction available Patient Re-evaluated:Patient Re-evaluated prior to induction Oxygen Delivery Method: Circle system utilized Preoxygenation: Pre-oxygenation with 100% oxygen Induction Type: IV induction Ventilation: Mask ventilation without difficulty LMA: LMA inserted LMA Size: 3.5 Tube type: Oral Number of attempts: 1 Placement Confirmation: positive ETCO2 and breath sounds checked- equal and bilateral Tube secured with: Tape Dental Injury: Teeth and Oropharynx as per pre-operative assessment

## 2021-06-19 LAB — COMPREHENSIVE METABOLIC PANEL
ALT: 11 U/L (ref 0–44)
AST: 15 U/L (ref 15–41)
Albumin: 3.1 g/dL — ABNORMAL LOW (ref 3.5–5.0)
Alkaline Phosphatase: 46 U/L (ref 38–126)
Anion gap: 8 (ref 5–15)
BUN: 17 mg/dL (ref 8–23)
CO2: 24 mmol/L (ref 22–32)
Calcium: 8.9 mg/dL (ref 8.9–10.3)
Chloride: 100 mmol/L (ref 98–111)
Creatinine, Ser: 0.98 mg/dL (ref 0.44–1.00)
GFR, Estimated: >60 mL/min (ref >60)
Glucose, Bld: 271 mg/dL — ABNORMAL HIGH (ref 70–99)
Potassium: 3.7 mmol/L (ref 3.5–5.1)
Sodium: 132 mmol/L — ABNORMAL LOW (ref 135–145)
Total Bilirubin: 0.8 mg/dL (ref 0.3–1.2)
Total Protein: 7.2 g/dL (ref 6.5–8.1)

## 2021-06-19 LAB — GLUCOSE, CAPILLARY
Glucose-Capillary: 227 mg/dL — ABNORMAL HIGH (ref 70–99)
Glucose-Capillary: 236 mg/dL — ABNORMAL HIGH (ref 70–99)
Glucose-Capillary: 272 mg/dL — ABNORMAL HIGH (ref 70–99)
Glucose-Capillary: 252 mg/dL — ABNORMAL HIGH (ref 70–99)

## 2021-06-19 LAB — CBC
HCT: 33.9 % — ABNORMAL LOW (ref 36.0–46.0)
Hemoglobin: 11.8 g/dL — ABNORMAL LOW (ref 12.0–15.0)
MCH: 28.9 pg (ref 26.0–34.0)
MCHC: 34.8 g/dL (ref 30.0–36.0)
MCV: 83.1 fL (ref 80.0–100.0)
Platelets: 243 10*3/uL (ref 150–400)
RBC: 4.08 MIL/uL (ref 3.87–5.11)
RDW: 13.9 % (ref 11.5–15.5)
WBC: 9 10*3/uL (ref 4.0–10.5)
nRBC: 0 % (ref 0.0–0.2)

## 2021-06-19 MED ORDER — VANCOMYCIN HCL 1750 MG/350ML IV SOLN
1750.0000 mg | INTRAVENOUS | Status: AC
  Administered 2021-06-19 – 2021-06-20 (×2): 1750 mg via INTRAVENOUS
  Filled 2021-06-19 (×2): qty 350

## 2021-06-19 NOTE — Progress Notes (Addendum)
PROGRESS NOTE  Brooke Miles VOZ:[id number] DOB: 09-Jun-1956 DOA: 06/17/2021 PCP: [facility]   LOS: 1 day   Brief Narrative / Interim history: 65 year old female with history of HLD, NIDDM, HTN, A. fib on Eliquis who comes into the hospital for left knee pain.  Started about 3 days ago, no trauma, also reports subjective fevers.  This is never happened to her before.  Fluid drawn in the ED shows a WBC of 46K with 98% neutrophils.  Orthopedic surgery consulted  Subjective / 24h Interval events: Her knee feels better.  No other complaints, no chest pain, no abdominal pain, no nausea or vomiting  Assessment & Plan: Principal Problem Concern for septic joint-gram stain was negative but had significant WBC elevation with neutrophil shift.  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  Most recent 2D echo done October 2022 shows EF 30-35% with global hypokinesis.  She underwent a cath in October 2022 which showed moderate severe noncritical CAD.  It was felt like her LVEF is out of proportion to the CAD, suggesting nonischemic cardiomyopathy  Persistent A. fib-continue Coreg, anticoagulated with Eliquis which is now on hold.  Resume per Ortho.  Hypokalemia-replete and monitor  Hyponatremia-mild, stable, monitor.  There is some degree of chronicity to it  Scheduled Meds:  apixaban  5 mg Oral BID   atorvastatin  40 mg Oral QHS    carvedilol  25 mg Oral BID WC   feeding supplement (GLUCERNA SHAKE)  237 mL Oral TID BM   insulin aspart  0-15 Units Subcutaneous TID WC   insulin aspart  0-5 Units Subcutaneous QHS   losartan  100 mg Oral Daily   multivitamin with minerals  1 tablet Oral Daily   spironolactone  25 mg Oral Daily   Continuous Infusions:  sodium chloride 10 mL/hr at 06/19/21 0539   ceFEPime (MAXIPIME) IV 2 g (06/19/21 0540)   vancomycin 2,000 mg (06/18/21 2112)   PRN Meds:.sodium chloride, acetaminophen **OR** acetaminophen, morphine injection, ondansetron **OR** ondansetron (ZOFRAN) IV, oxyCODONE, traMADol  Diet Orders (From admission, onward)     Start     Ordered   06/18/21 1826  Diet regular Room service appropriate? Yes; Fluid consistency: Thin  Diet effective now       Question Answer Comment  Room service appropriate? Yes   Fluid consistency: Thin      06/18/21 1825            DVT prophylaxis: Place TED hose Start: 06/17/21 1900 apixaban (ELIQUIS) tablet 5 mg     Code Status: Full Code  Family Communication: No family at bedside  Status is: Inpatient  Inpatient appropriate due to IV antibiotics, culture monitoring  Level of care: Med-Surg  Consultants:  Orthopedic surgery Infectious disease  Procedures:  none  Microbiology  Blood cultures 11/22-no growth to date Knee aspirate fluid culture 11/22-no growth to date  Antimicrobials: Comycin, cefepime 11/23   Objective: Vitals:   06/18/21 1808 06/18/21 2043 06/19/21 0519 06/19/21 0726  BP: 123/61 130/87 138/86 139/82  Pulse: 86  79 85 84  Resp: 14 17 17 16   Temp: 97.8 F (36.6 C) (!) 97.5 F (36.4 C) 97.8 F (36.6 C) 98 F (36.7 C)  TempSrc: Oral     SpO2: 100% 100% 100% 100%  Weight:      Height:        Intake/Output Summary (Last 24 hours) at 06/19/2021 1035 Last data filed at 06/19/2021 0542 Gross per 24 hour  Intake 1002.6 ml  Output 505 ml  Net 497.6 ml    Filed Weights   06/17/21 1302   Weight: 86.6 kg    Examination:  Constitutional: No distress Eyes: Anicteric ENMT: mmm Neck: normal, supple Respiratory: CTA bilaterally without wheezing or crackles, normal respiratory effort Cardiovascular: Regular rate and rhythm, no murmurs, no edema Abdomen: Soft, NT, ND, bowel sounds positive Musculoskeletal: no clubbing / cyanosis.  Left knee Ace wrap Skin: No rashes seen Neurologic: no focal deficits  Data Reviewed: I have independently reviewed following labs and imaging studies   CBC: Recent Labs  Lab 06/17/21 1304 06/18/21 0330 06/19/21 0448  WBC 9.8 8.8 9.0  NEUTROABS 7.5  --   --   HGB 13.6 12.1 11.8*  HCT 39.3 34.8* 33.9*  MCV 83.8 81.7 83.1  PLT 279 233 243    Basic Metabolic Panel: Recent Labs  Lab 06/17/21 1304 06/18/21 0330 06/19/21 0448  NA 129* 131* 132*  K 3.2* 3.3* 3.7  CL 93* 97* 100  CO2 25 27 24   GLUCOSE 333* 208* 271*  BUN 14 12 17   CREATININE 0.93 0.73 0.98  CALCIUM 9.7 9.3 8.9  MG 1.3*  --   --     Liver Function Tests: Recent Labs  Lab 06/17/21 1304 06/19/21 0448  AST 27 15  ALT 12 11  ALKPHOS 46 46  BILITOT 0.9 0.8  PROT 7.7 7.2  ALBUMIN 3.9 3.1*    Coagulation Profile: No results for input(s): INR, PROTIME in the last 168 hours. HbA1C: No results for input(s): HGBA1C in the last 72 hours. CBG: Recent Labs  Lab 06/18/21 0810 06/18/21 1250 06/18/21 1715 06/18/21 2045 06/19/21 0734  GLUCAP 200* 199* 171* 306* 227*     Recent Results (from the past 240 hour(s))  Chlamydia/NGC rt PCR ([facility] only)     Status: None   Collection Time: 06/17/21  3:03 PM   Specimen: Urine  Result Value Ref Range Status   Specimen source GC/Chlam URINE, RANDOM  Final   Chlamydia Tr NOT DETECTED NOT DETECTED Final   N gonorrhoeae NOT DETECTED NOT DETECTED Final    Comment: (NOTE) This CT/NG assay has not been evaluated in patients with a history of  hysterectomy. Performed at [facility], [address]  [place], [facility] [address]   Body fluid culture w Gram Stain     Status: None (Preliminary result)   Collection Time: 06/17/21  3:46 PM   Specimen: KNEE; Body Fluid  Result Value Ref Range Status   Specimen Description   Final    KNEE Performed at [facility], [address], [place], [facility] [address]    Special Requests   Final    NONE Performed at [facility], [address] [place], [facility] [address]    Gram Stain   Final    FEW WBC PRESENT, PREDOMINANTLY MONONUCLEAR NO ORGANISMS SEEN    Culture   Final    NO GROWTH 2 DAYS Performed at [facility], [address] [place], [facility] [address]  Report Status PENDING  Incomplete  Resp Panel by RT-PCR (Flu A&B, Covid) Nasopharyngeal Swab     Status: None   Collection Time: 06/17/21  6:08 PM   Specimen: Nasopharyngeal Swab; Nasopharyngeal(NP) swabs in vial transport medium  Result Value Ref Range Status   SARS Coronavirus 2 by RT PCR NEGATIVE NEGATIVE Final    Comment: (NOTE) SARS-CoV-2 target nucleic acids are NOT DETECTED.  The SARS-CoV-2 RNA is generally detectable in upper respiratory specimens during the acute phase of infection. The lowest concentration of SARS-CoV-2 viral copies this assay can detect is 138 copies/mL. A negative result does not preclude SARS-Cov-2 infection and should not be used as the sole basis for treatment or other patient management decisions. A negative result may occur with  improper specimen collection/handling, submission of specimen other than nasopharyngeal swab, presence of viral mutation(s) within the areas targeted by this assay, and inadequate number of viral copies(<138 copies/mL). A negative result must be combined with clinical observations, patient history, and epidemiological information. The expected result is Negative.  Fact Sheet for Patients:  BloggerCourse.com  Fact Sheet for Healthcare Providers:   SeriousBroker.it  This test is no t yet approved or cleared by the [place] FDA and  has been authorized for detection and/or diagnosis of SARS-CoV-2 by FDA under an Emergency Use Authorization (EUA). This EUA will remain  in effect (meaning this test can be used) for the duration of the COVID-19 declaration under Section 564(b)(1) of the Act, 21 U.S.C.section 360bbb-3(b)(1), unless the authorization is terminated  or revoked sooner.       Influenza A by PCR NEGATIVE NEGATIVE Final   Influenza B by PCR NEGATIVE NEGATIVE Final    Comment: (NOTE) The Xpert Xpress SARS-CoV-2/FLU/RSV plus assay is intended as an aid in the diagnosis of influenza from Nasopharyngeal swab specimens and should not be used as a sole basis for treatment. Nasal washings and aspirates are unacceptable for Xpert Xpress SARS-CoV-2/FLU/RSV testing.  Fact Sheet for Patients: BloggerCourse.com  Fact Sheet for Healthcare Providers: SeriousBroker.it  This test is not yet approved or cleared by the [place] FDA and has been authorized for detection and/or diagnosis of SARS-CoV-2 by FDA under an Emergency Use Authorization (EUA). This EUA will remain in effect (meaning this test can be used) for the duration of the COVID-19 declaration under Section 564(b)(1) of the Act, 21 U.S.C. section 360bbb-3(b)(1), unless the authorization is terminated or revoked.  Performed at [facility], [address] [place], [place] [address]   Blood culture (routine x 2)     Status: None (Preliminary result)   Collection Time: 06/17/21  6:41 PM   Specimen: BLOOD  Result Value Ref Range Status   Specimen Description BLOOD BLOOD RIGHT HAND  Final   Special Requests   Final    BOTTLES DRAWN AEROBIC ONLY Blood Culture results may not be optimal due to an inadequate volume of blood received in culture bottles   Culture   Final    NO  GROWTH 2 DAYS Performed at [facility], [address] [place], [place] [address]    Report Status PENDING  Incomplete  Blood culture (routine x 2)     Status: None (Preliminary result)   Collection Time: 06/17/21  9:48 PM   Specimen: BLOOD  Result Value Ref Range Status   Specimen Description BLOOD BLOOD LEFT HAND  Final   Special Requests   Final    BOTTLES DRAWN AEROBIC AND ANAEROBIC Blood Culture adequate volume  Culture   Final    NO GROWTH 2 DAYS Performed at [facility], [address] [place], [place] [address]    Report Status PENDING  Incomplete  Aerobic/Anaerobic Culture w Gram Stain (surgical/deep wound)     Status: None (Preliminary result)   Collection Time: 06/18/21  3:43 PM   Specimen: Joint, Other; Body Fluid  Result Value Ref Range Status   Specimen Description   Final    SYNOVIAL LEFT KNEE Performed at [facility], [address], [place], [place] [address]    Special Requests   Final    NONE Performed at [facility], [address] [place], [place] [address]    Gram Stain PENDING  Incomplete   Culture   Final    NO GROWTH < 12 HOURS Performed at [facility], [address], [place], [place] [address]    Report Status PENDING  Incomplete      Radiology Studies: No results found.  Pamella Pert, MD, PhD [facility]  Between 7 am - 7 pm I am available, please contact me via Amion (for emergencies) or Securechat (non urgent messages)  Between 7 pm - 7 am I am not available, please contact night coverage MD/APP via Amion

## 2021-06-19 NOTE — Progress Notes (Signed)
Subjective:  POD #1 s/p left knee arthroscopic irrigation, lateral meniscal debridement with extensive synovectomy.   Patient reports left knee pain as moderate.    Objective:   VITALS:   Vitals:   06/19/21 0519 06/19/21 0726 06/19/21 1114 06/19/21 1117  BP: 138/86 139/82 129/81 127/70  Pulse: 85 84 91 79  Resp: 17 16 15 15   Temp: 97.8 F (36.6 C) 98 F (36.7 C) 98 F (36.7 C) 98 F (36.7 C)  TempSrc:      SpO2: 100% 100% 100% 100%  Weight:      Height:        PHYSICAL EXAM: Left knee extremity: Patient's left knee dressing is clean dry and intact.  Injury of motion of the left knee is limited due to pain. The left lower extremity is: Neurovascular intact Sensation intact distally Intact pulses distally Dorsiflexion/Plantar flexion intact No cellulitis present Compartment soft  LABS  Results for orders placed or performed during the hospital encounter of 06/17/21 (from the past 24 hour(s))  Aerobic/Anaerobic Culture w Gram Stain (surgical/deep wound)     Status: None (Preliminary result)   Collection Time: 06/18/21  3:43 PM   Specimen: Joint, Other; Body Fluid  Result Value Ref Range   Specimen Description      SYNOVIAL LEFT KNEE Performed at [facility], [address], [place], [place]    Special Requests      NONE Performed at [facility], [address] [place], [place]    Gram Stain PENDING    Culture      NO GROWTH < 12 HOURS Performed at [facility], [address], [place], [place]    Report Status PENDING   Glucose, capillary     Status: Abnormal   Collection Time: 06/18/21  5:15 PM  Result Value Ref Range   Glucose-Capillary 171 (H) 70 - 99 mg/dL   Comment 1 Notify RN    Comment 2 Document in Chart   Glucose, capillary     Status: Abnormal   Collection Time: 06/18/21  8:45 PM  Result Value Ref Range   Glucose-Capillary 306 (H) 70 - 99 mg/dL   Comment 1 Notify RN   Comprehensive  metabolic panel     Status: Abnormal   Collection Time: 06/19/21  4:48 AM  Result Value Ref Range   Sodium 132 (L) 135 - 145 mmol/L   Potassium 3.7 3.5 - 5.1 mmol/L   Chloride 100 98 - 111 mmol/L   CO2 24 22 - 32 mmol/L   Glucose, Bld 271 (H) 70 - 99 mg/dL   BUN 17 8 - 23 mg/dL   Creatinine, Ser 06/21/21 0.44 - 1.00 mg/dL   Calcium 8.9 8.9 - 8.88 mg/dL   Total Protein 7.2 6.5 - 8.1 g/dL   Albumin 3.1 (L) 3.5 - 5.0 g/dL   AST 15 15 - 41 U/L   ALT 11 0 - 44 U/L   Alkaline Phosphatase 46 38 - 126 U/L   Total Bilirubin 0.8 0.3 - 1.2 mg/dL   GFR, Estimated 91.6 >94 mL/min   Anion gap 8 5 - 15  CBC     Status: Abnormal   Collection Time: 06/19/21  4:48 AM  Result Value Ref Range   WBC 9.0 4.0 - 10.5 K/uL   RBC 4.08 3.87 - 5.11 MIL/uL   Hemoglobin 11.8 (L) 12.0 - 15.0 g/dL   HCT 06/21/21 (L) 38.8 - 82.8 %   MCV  83.1 80.0 - 100.0 fL   MCH 28.9 26.0 - 34.0 pg   MCHC 34.8 30.0 - 36.0 g/dL   RDW 49.7 02.6 - 37.8 %   Platelets 243 150 - 400 K/uL   nRBC 0.0 0.0 - 0.2 %  Glucose, capillary     Status: Abnormal   Collection Time: 06/19/21  7:34 AM  Result Value Ref Range   Glucose-Capillary 227 (H) 70 - 99 mg/dL   Comment 1 Notify RN    Comment 2 Document in Chart   Glucose, capillary     Status: Abnormal   Collection Time: 06/19/21 11:19 AM  Result Value Ref Range   Glucose-Capillary 236 (H) 70 - 99 mg/dL   Comment 1 Notify RN    Comment 2 Document in Chart     DG Chest Portable 1 View  Result Date: 06/17/2021 CLINICAL DATA:  Lactic acidosis EXAM: PORTABLE CHEST 1 VIEW COMPARISON:  None. FINDINGS: The heart size and mediastinal contours are within normal limits. Both lungs are clear. The visualized skeletal structures are unremarkable. IMPRESSION: No active disease. Electronically Signed   By: Sharlet Salina M.D.   On: 06/17/2021 18:30   DG Knee Complete 4 Views Left  Result Date: 06/17/2021 CLINICAL DATA:  Left knee pain and swelling EXAM: LEFT KNEE - COMPLETE 4+ VIEW COMPARISON:   None. FINDINGS: No acute fracture or dislocation. Tricompartmental joint space narrowing and marginal osteophytes. Moderate to large supra patellar joint effusion. Calcification on the medial aspect of the medial femoral condyle, likely sequela of prior ligamentous injury. IMPRESSION: 1.  No acute fracture or dislocation. 2. 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 Electronically Signed   By: Larose Hires D.O.   On: 06/17/2021 14:11    Assessment/Plan: 1 Day Post-Op   Principal Problem:   Septic joint (HCC) Active Problems:   Type 2 diabetes mellitus (HCC)   Hypertension associated with diabetes (HCC)   Hypokalemia   Hyponatremia   Elevated lactic acid level   Arthritis, septic, knee (HCC)  Awaiting final cultures from the patient's left knee aspiration.  Also awaiting pathology results from synovial samples.  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  Appreciate ID following.  Patient will be ordered for PT/OT.  Discharge when cleared to do so by infectious disease.  I will follow-up with the patient in my office in 7 to 10 days after discharge.  I will follow the culture and pathology results from her surgery.    Juanell Fairly , MD 06/19/2021, 1:37 PM

## 2021-06-19 NOTE — Consult Note (Signed)
Pharmacy Antibiotic Note  Brooke Miles is a 65 y.o. female admitted on 06/17/2021 with  septic joint .  Pharmacy has been consulted for vancomycin and cefepime dosing.  Plan:  Cefepime 2 g IV q8h  Patient only received 1 g IV LD of Vancomycin followed by 2000 mg q24H. Scr trending up. Will decrease dose to 1750 mg q24H for a predicted AUC of 530. Goal AUC Of 400-550. Obtain vancomycin level prior to the 4th or 5th dose.   Height: 5\' 8"  (172.7 cm) Weight: 86.6 kg (191 lb) IBW/kg (Calculated) : 63.9  Temp (24hrs), Avg:98.1 F (36.7 C), Min:97.5 F (36.4 C), Max:99.3 F (37.4 C)  Recent Labs  Lab 06/17/21 1304 06/17/21 2156 06/18/21 0330 06/19/21 0448  WBC 9.8  --  8.8 9.0  CREATININE 0.93  --  0.73 0.98  LATICACIDVEN 4.8* 2.7*  --   --      Estimated Creatinine Clearance: 66.8 mL/min (by C-G formula based on SCr of 0.98 mg/dL).    Allergies  Allergen Reactions   Quinolones Hives and Itching    Antimicrobials this admission: Vancomycin 11/22 >>  Cefepime 11/22 >>   Dose adjustments this admission: N/A  Microbiology results: 11/22 BCx: NGTD 11/22 L knee synovial culture: pending 11/22 Chlamydia / gonococcal PCR: not detected  Thank you for allowing pharmacy to be a part of this patient's care.  12/22 06/19/2021 12:38 PM

## 2021-06-19 NOTE — TOC Progression Note (Signed)
Transition of Care [facility]) - Progression Note    Patient Details  Name: Brooke Miles MRN: [id number] Date of Birth: 09-07-1955  Transition of Care [facility]) CM/SW Contact  Marlowe Sax, RN Phone Number: 06/19/2021, 10:25 AM  Clinical Narrative:    Cultures are pending from the Knee PT to eval, TOC to continue to follow for needs and DC planning        Expected Discharge Plan and Services                                                 Social Determinants of Health (SDOH) Interventions    Readmission Risk Interventions No flowsheet data found.

## 2021-06-19 NOTE — Op Note (Addendum)
PATIENT:  Brooke Miles  PRE-OPERATIVE DIAGNOSIS: Left septic knee joint  POST-OPERATIVE DIAGNOSIS: Possible left septic knee joint versus possible pigmented villonodular synovitis, tricompartmental osteoarthritis, torn discoid lateral meniscus  PROCEDURE: Left knee arthroscopic irrigation, synovial biopsy, extensive tricompartmental synovectomy, and chondroplasty of the medial femoral condyle, trochlea and undersurface of the patella, debridement of the lateral discoid meniscus  SURGEON:  Thornton Park, MD  ANESTHESIA:   General  EBL: minimal  SPECIMENS: Synovial fluid to the lab for cell count, gram stain and culture and synovial biopsies to pathology to evaluate for possible Pigmented villonodular synovitis  PREOPERATIVE INDICATIONS:  Brooke Miles  65 y.o. female with a diagnosis of acute onset of left knee pain, mid range of motion and swelling with difficulty weightbearing concerning for possible left septic knee joint.  The risks benefits and alternatives were discussed with the patient preoperatively including the risks of infection, bleeding, nerve injury, knee stiffness, persistent pain, osteoarthritis and the need for further surgery. Medical  risks include DVT and pulmonary embolism, myocardial infarction, stroke, pneumonia, respiratory failure and death. The patient understood these risks and wished to proceed.  OPERATIVE FINDINGS: Extensive tricompartmental hyperproliferative synovitis.  Patient had tricompartmental osteoarthritis with diffuse chondromalacia, most evident in the medial femoral condyle, lateral tibial plateau, trochlea and undersurface of the patella.  OPERATIVE PROCEDURE: Patient was met in the preoperative area. The operative extremity was signed with the word yes and my initials according the hospital's correct site of surgery protocol.  The patient was brought to the operating room where she was placed supine on the operative table. General  anesthesia was administered. The patient was prepped and draped in a sterile fashion.  A timeout was performed to verify the patient's name, date of birth, medical record number, correct site of surgery correct procedure to be performed. It was also used to verify the patient received antibiotics that all appropriate instruments, and radiographic studies were available in the room. Once all in attendance were in agreement, the case began.  Proposed arthroscopy incisions were drawn out with a surgical marker. These were pre-injected with 1% lidocaine plain. An 11 blade was used to establish an inferior lateral and inferomedial portals.  Once the arthroscopic cannula was placed into the inferior lateral portal, approximately 40 to 50 cc of cloudy synovial fluid was collected in a specimen cup.  It was sent to the lab for cell count, gram stain and culture.    A full diagnostic examination of the knee was performed including the suprapatellar pouch, patellofemoral joint, medial and lateral compartments as well as the medial lateral gutters, the intercondylar notch and the posterior knee.  Findings on arthroscopy included extensive tricompartmental hyperemic and hyperproliferative synovitis.  Patient had extensive diffuse compartmental chondromalacia.  Patient had a tear of a discoid lateral meniscus as well.  Patient had cloudy synovial fluid seen on her arthroscopy.  Biopsies of the synovium were taken using a pituitary grasper.  These were sent to pathology for evaluation for pigmented villonodular synovitis.      Visualization was extremely difficult initially given the synovial overgrowth.  An extensive tricompartmental synovectomy performed using a Dyonics tapered shaver blade and 90 degree werewolf wand.  The lateral discoid meniscal tear treated with a 4-0 resector shaver blade and straight duckbill biter, 90 degree right biter and a backbiter. The lateral discoid meniscus was debrided until a stable  rim was achieved. A chondroplasty of the medial femoral condyle, trochlea and undersurface of patella was also performed using  a 4-0 Dyonics tapered shaver blade.  Knee was thoroughly irrigated and lavaged.  The knee was then copiously lavaged. All arthroscopic instruments were removed. The two arthroscopy portals were closed with 4-0 nylon.  3 cc of 0.5% Marcaine plain was injected into the right knee joint at the conclusion the case for assistance with postop pain control.  Steri-Strips were applied along with a dry, sterile and compressive dressing. The patient was brought to the PACU in stable condition. I was scrubbed and present for the entire case and all sharp and instrument counts were correct at the conclusion the case. I spoke with the patient's son postoperatively by phone to let him know the case was performed without complication and the patient was stable in the recovery room.    Timoteo Gaul, MD

## 2021-06-20 DIAGNOSIS — M12262 Villonodular synovitis (pigmented), left knee: Secondary | ICD-10-CM

## 2021-06-20 LAB — LD, BODY FLUID (OTHER)

## 2021-06-20 LAB — GLUCOSE, CAPILLARY
Glucose-Capillary: 218 mg/dL — ABNORMAL HIGH (ref 70–99)
Glucose-Capillary: 244 mg/dL — ABNORMAL HIGH (ref 70–99)
Glucose-Capillary: 302 mg/dL — ABNORMAL HIGH (ref 70–99)
Glucose-Capillary: 250 mg/dL — ABNORMAL HIGH (ref 70–99)

## 2021-06-20 MED ORDER — CEFADROXIL 500 MG PO CAPS
1000.0000 mg | ORAL_CAPSULE | Freq: Two times a day (BID) | ORAL | Status: DC
  Administered 2021-06-21: 1000 mg via ORAL
  Filled 2021-06-20 (×2): qty 2

## 2021-06-20 MED ORDER — SULFAMETHOXAZOLE-TRIMETHOPRIM 800-160 MG PO TABS
1.0000 | ORAL_TABLET | Freq: Two times a day (BID) | ORAL | Status: DC
  Administered 2021-06-21: 1 via ORAL
  Filled 2021-06-20: qty 1

## 2021-06-20 NOTE — TOC Progression Note (Signed)
Transition of Care [facility]) - Progression Note    Patient Details  Name: Brooke Miles MRN: [id number] Date of Birth: 27-May-1956  Transition of Care [facility]) CM/SW Contact  Marlowe Sax, RN Phone Number: 06/20/2021, 10:25 AM  Clinical Narrative:     She underwent washout in the OR on 11/23 per orthopedic surgery, Cultures are Pending, PT to evaluate and provide recommendations, TOC to continue to follow for DC planning and Assist with needs      Expected Discharge Plan and Services                                                 Social Determinants of Health (SDOH) Interventions    Readmission Risk Interventions No flowsheet data found.

## 2021-06-20 NOTE — Evaluation (Signed)
Physical Therapy Evaluation Patient Details Name: Brooke Miles MRN: [id number] DOB: 1955/11/27 Today's Date: 06/20/2021  History of Present Illness  Pt is a 65 y.o. female with a PMH of hyperlipidemia, non-insulin-dependent diabetes mellitus, hypertension, CHF, on anticoagulation with Eliquis, atrial fibrillation, who presents emergency department for chief concerns of left knee pain. 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   Clinical Impression  Pt was pleasant and motivated to participate during the session and put forth good effort throughout. Pt was able to complete all ther ex supine in bed. Pt required min assist with bed mobility for LLE control due to pain and weakness. Pt required min guard for safety when performing STS and during ambulation and demonstrated a slow but consistent cadence. Pt was able to follow proper stair sequencing with min guard for safety and min cuing for hand placement. Pt will benefit from HHPT upon discharge to safely address deficits listed in patient problem list for decreased caregiver assistance and eventual return to PLOF.   Recommendations for follow up therapy are one component of a multi-disciplinary discharge planning process, led by the attending physician.  Recommendations may be updated based on patient status, additional functional criteria and insurance authorization.  Follow Up Recommendations Home health PT    Assistance Recommended at Discharge Intermittent Supervision/Assistance  Functional Status Assessment Patient has had a recent decline in their functional status and demonstrates the ability to make significant improvements in function in a reasonable and predictable amount of time.  Equipment Recommendations   Rolling walker (2 wheels)    Recommendations for Other Services       Precautions / Restrictions Precautions Precautions: Fall Restrictions Weight Bearing Restrictions: No      Mobility  Bed Mobility Overal bed mobility: Needs Assistance Bed Mobility: Supine to Sit     Supine to sit: Min assist     General bed mobility comments: Min assist for LLE control    Transfers Overall transfer level: Needs assistance Equipment used: Rolling walker (2 wheels) Transfers: Sit to/from Stand Sit to Stand: Min guard           General transfer comment: Increased time and effort, min guard for safety, min cuing for hand placement and usage of RW    Ambulation/Gait Ambulation/Gait assistance: Min guard Gait Distance (Feet): 120 Feet x2 Assistive device: Rolling walker (2 wheels) Gait Pattern/deviations: Step-through pattern;Decreased step length - right;Decreased step length - left;Decreased stride length;Trunk flexed Gait velocity: decreased     General Gait Details: Increased time and effort, no increase in pain, demonstrated slow but consistent cadence  Stairs Stairs: Yes Stairs assistance: Min guard Stair Management: One rail Left;Step to pattern;Sideways Number of Stairs: 4 General stair comments: Min guard for safety, min cuing for hand placement and proper sequencing  Wheelchair Mobility    Modified Rankin (Stroke Patients Only)       Balance Overall balance assessment: Needs assistance Sitting-balance support: Bilateral upper extremity supported;Feet supported Sitting balance-Leahy Scale: Good     Standing balance support: Bilateral upper extremity supported;During functional activity;Reliant on assistive device for balance Standing balance-Leahy Scale: Good                               Pertinent Vitals/Pain Pain Assessment: No/denies pain    Home Living Family/patient expects  to be discharged to:: Private residence Living Arrangements:  Children;Other relatives (Son, son's wife, son's mother-in-law) Available Help at Discharge: Family;Available 24 hours/day Type of Home: House Home Access: Stairs to enter Entrance Stairs-Rails: Left Entrance Stairs-Number of Steps: 1 Alternate Level Stairs-Number of Steps: 15 Home Layout: Two level Home Equipment: None      Prior Function Prior Level of Function : Independent/Modified Independent             Mobility Comments: Pt reports independent with mobility, no AD, no falls ADLs Comments: Pt reports independent with ADLs     Hand Dominance        Extremity/Trunk Assessment   Upper Extremity Assessment Upper Extremity Assessment: Overall WFL for tasks assessed    Lower Extremity Assessment Lower Extremity Assessment: Generalized weakness;LLE deficits/detail LLE Deficits / Details: L knee septic joint       Communication   Communication: No difficulties  Cognition Arousal/Alertness: Awake/alert Behavior During Therapy: WFL for tasks assessed/performed Overall Cognitive Status: Within Functional Limits for tasks assessed                                          General Comments      Exercises Total Joint Exercises Ankle Circles/Pumps: AROM;Both;10 reps;Supine Quad Sets: AROM;Both;10 reps;Supine Long Arc Quad: AROM;Both;10 reps;Seated Other Exercises Other Exercises: Pt and family education on role of PT and role in discharge planning   Assessment/Plan    PT Assessment Patient needs continued PT services  PT Problem List Decreased strength;Decreased balance;Decreased mobility;Decreased knowledge of use of DME       PT Treatment Interventions DME instruction;Gait training;Stair training;Functional mobility training;Therapeutic activities;Therapeutic exercise;Balance training;Patient/family education    PT Goals (Current goals can be found in the Care Plan section)  Acute Rehab PT Goals Patient Stated Goal: to go home PT Goal  Formulation: With patient Time For Goal Achievement: 07/04/21 Potential to Achieve Goals: Good    Frequency Min 2X/week   Barriers to discharge        Co-evaluation               AM-PAC PT "6 Clicks" Mobility  Outcome Measure Help needed turning from your back to your side while in a flat bed without using bedrails?: A Little Help needed moving from lying on your back to sitting on the side of a flat bed without using bedrails?: A Little Help needed moving to and from a bed to a chair (including a wheelchair)?: A Little Help needed standing up from a chair using your arms (e.g., wheelchair or bedside chair)?: A Little Help needed to walk in hospital room?: A Little Help needed climbing 3-5 steps with a railing? : A Little 6 Click Score: 18    End of Session Equipment Utilized During Treatment: Gait belt Activity Tolerance: Patient tolerated treatment well Patient left: in chair;with call bell/phone within reach;with chair alarm set;with family/visitor present;Other (comment) (with ice reapplied) Nurse Communication: Mobility status PT Visit Diagnosis: Muscle weakness (generalized) (M62.81);Difficulty in walking, not elsewhere classified (R26.2)    Time: 6168-3729 PT Time Calculation (min) (ACUTE ONLY): 48 min   Charges:              Hildred Alamin SPT 06/20/21, 1:45 PM

## 2021-06-20 NOTE — Progress Notes (Signed)
PROGRESS NOTE  Brooke Miles [id number] DOB: [birth year] DOA: 06/17/2021 PCP: Hilliard, P.A.   LOS: 2 days   Brief Narrative / Interim history: 65 year old female with history of HLD, NIDDM, HTN, A. fib on Eliquis who comes into the hospital for left knee pain.  Started about 3 days ago, no trauma, also reports subjective fevers.  This is never happened to her before.  Fluid drawn in the ED shows a WBC of 46K with 98% neutrophils.  Orthopedic surgery consulted  Subjective / 24h Interval events: Knee feels better. Able to ambulate in the room. She is about to work with PT Planning to fly back to [place] this Sunday  Assessment & Plan: Principal Problem Concern for septic joint-gram stain was negative but had significant WBC elevation with neutrophil shift.  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  Most recent 2D echo done October 2022 shows EF 30-35% with global hypokinesis.  She underwent a cath in October 2022 which showed moderate severe noncritical CAD.  It was felt like her LVEF is out of proportion to the CAD, suggesting nonischemic cardiomyopathy  Persistent A. fib-continue Coreg, anticoagulated with Eliquis which is now on hold.  Resume per Ortho.  Hypokalemia-replete and monitor  Hyponatremia-mild, stable, monitor.  There is some degree of chronicity to it  Scheduled Meds:  apixaban  5 mg  Oral BID   atorvastatin  40 mg Oral QHS   carvedilol  25 mg Oral BID WC   feeding supplement (GLUCERNA SHAKE)  237 mL Oral TID BM   insulin aspart  0-15 Units Subcutaneous TID WC   insulin aspart  0-5 Units Subcutaneous QHS   losartan  100 mg Oral Daily   multivitamin with minerals  1 tablet Oral Daily   spironolactone  25 mg Oral Daily   Continuous Infusions:  sodium chloride 10 mL/hr at 06/19/21 2221   ceFEPime (MAXIPIME) IV 2 g (06/20/21 0511)   vancomycin 1,750 mg (06/19/21 2348)   PRN Meds:.sodium chloride, acetaminophen **OR** acetaminophen, morphine injection, ondansetron **OR** ondansetron (ZOFRAN) IV, oxyCODONE, traMADol  Diet Orders (From admission, onward)     Start     Ordered   06/18/21 1826  Diet regular Room service appropriate? Yes; Fluid consistency: Thin  Diet effective now       Question Answer Comment  Room service appropriate? Yes   Fluid consistency: Thin      11 /23/22 1825            DVT prophylaxis: Place TED hose Start: 06/17/21 1900 apixaban (ELIQUIS) tablet 5 mg     Code Status: Full Code  Family Communication: No family at bedside  Status is: Inpatient  Inpatient appropriate due to IV antibiotics, culture monitoring  Level of care: Med-Surg  Consultants:  Orthopedic surgery Infectious disease  Procedures:  none  Microbiology  Blood cultures 11/22-no growth to date Knee aspirate fluid culture 11/22-no growth to date  Antimicrobials: Comycin, cefepime 11/23   Objective: Vitals:   06/19/21 1735 06/19/21 2143 06/20/21 0523 06/20/21 [id number]  BP: 138/84 124/78 119/80 112/64  Pulse: 81 90 85 81  Resp: 15 16 18 15   Temp: 98.1 F (36.7 C) 98.2 F (36.8 C) 98.3 F (36.8 C) 98.5 F (36.9 C)  TempSrc:   Oral   SpO2: 99% 99% 99% 98%  Weight:      Height:        Intake/Output Summary (Last 24 hours) at 06/20/2021 1111 Last data filed at 06/20/2021 0600 Gross per 24 hour  Intake 360 ml  Output 200 ml  Net 160 ml    Filed  Weights   06/17/21 1302  Weight: 86.6 kg    Examination:  Constitutional: NAD Eyes: no icterus ENMT: mmm Neck: normal, supple Respiratory: CTA biL, no wheezing Cardiovascular: rrr, no mrg, no edema Abdomen: soft, nt, nd, bs+ Musculoskeletal: no clubbing / cyanosis.  Left knee Ace wrap Skin: no rashes Neurologic: non focal   Data Reviewed: I have independently reviewed following labs and imaging studies   CBC: Recent Labs  Lab 06/17/21 1304 06/18/21 0330 06/19/21 0448  WBC 9.8 8.8 9.0  NEUTROABS 7.5  --   --   HGB 13.6 12.1 11.8*  HCT 39.3 34.8* 33.9*  MCV 83.8 81.7 83.1  PLT 279 233 243    Basic Metabolic Panel: Recent Labs  Lab 06/17/21 1304 06/18/21 0330 06/19/21 0448  NA 129* 131* 132*  K 3.2* 3.3* 3.7  CL 93* 97* 100  CO2 25 27 24   GLUCOSE 333* 208* 271*  BUN 14 12 17   CREATININE 0.93 0.73 0.98  CALCIUM 9.7 9.3 8.9  MG 1.3*  --   --     Liver Function Tests: Recent Labs  Lab 06/17/21 1304 06/19/21 0448  AST 27 15  ALT 12 11  ALKPHOS 46 46  BILITOT 0.9 0.8  PROT 7.7 7.2  ALBUMIN 3.9 3.1*    Coagulation Profile: No results for input(s): INR, PROTIME in the last 168 hours. HbA1C: No results for input(s): HGBA1C in the last 72 hours. CBG: Recent Labs  Lab 06/19/21 0734 06/19/21 1119 06/19/21 1614 06/19/21 2143 06/20/21 0809  GLUCAP 227* 236* 272* 252* 218*     Recent Results (from the past 240 hour(s))  Chlamydia/NGC rt PCR ([facility] only)     Status: None   Collection Time: 06/17/21  3:03 PM   Specimen: Urine  Result Value Ref Range Status   Specimen source GC/Chlam URINE, RANDOM  Final   Chlamydia Tr NOT DETECTED NOT DETECTED Final   N gonorrhoeae NOT DETECTED NOT DETECTED Final    Comment: (NOTE) This CT/NG assay has not been evaluated in patients with a history of  hysterectomy. Performed at [facility], [address] [place], [facility] [address]   Body fluid culture w Gram Stain     Status: None (Preliminary  result)   Collection Time: 06/17/21  3:46 PM   Specimen: KNEE; Body Fluid  Result Value Ref Range Status   Specimen Description   Final    KNEE Performed at [facility], [address], [place], [facility] [address]    Special Requests   Final    NONE Performed at [facility], [address] [place], [facility] [address]    Gram Stain   Final    FEW WBC PRESENT, PREDOMINANTLY MONONUCLEAR NO ORGANISMS SEEN    Culture   Final    NO GROWTH 3 DAYS Performed at [facility], [address], [place], [facility] [address]    Report Status  PENDING  Incomplete  Resp Panel by RT-PCR (Flu A&B, Covid) Nasopharyngeal Swab     Status: None   Collection Time: 06/17/21  6:08 PM   Specimen: Nasopharyngeal Swab; Nasopharyngeal(NP) swabs in vial transport medium  Result Value Ref Range Status   SARS Coronavirus 2 by RT PCR NEGATIVE NEGATIVE Final    Comment: (NOTE) SARS-CoV-2 target nucleic acids are NOT DETECTED.  The SARS-CoV-2 RNA is generally detectable in upper respiratory specimens during the acute phase of infection. The lowest concentration of SARS-CoV-2 viral copies this assay can detect is 138 copies/mL. A negative result does not preclude SARS-Cov-2 infection and should not be used as the sole basis for treatment or other patient management decisions. A negative result may occur with  improper specimen collection/handling, submission of specimen other than nasopharyngeal swab, presence of viral mutation(s) within the areas targeted by this assay, and inadequate number of viral copies(<138 copies/mL). A negative result must be combined with clinical observations, patient history, and epidemiological information. The expected result is Negative.  Fact Sheet for Patients:  BloggerCourse.comhttps://www.fda.gov/media/152166/download  Fact Sheet for Healthcare Providers:  SeriousBroker.ithttps://www.fda.gov/media/152162/download  This test is no t yet approved or cleared by the [place] FDA  and  has been authorized for detection and/or diagnosis of SARS-CoV-2 by FDA under an Emergency Use Authorization (EUA). This EUA will remain  in effect (meaning this test can be used) for the duration of the COVID-19 declaration under Section 564(b)(1) of the Act, 21 U.S.C.section 360bbb-3(b)(1), unless the authorization is terminated  or revoked sooner.       Influenza A by PCR NEGATIVE NEGATIVE Final   Influenza B by PCR NEGATIVE NEGATIVE Final    Comment: (NOTE) The Xpert Xpress SARS-CoV-2/FLU/RSV plus assay is intended as an aid in the diagnosis of influenza from Nasopharyngeal swab specimens and should not be used as a sole basis for treatment. Nasal washings and aspirates are unacceptable for Xpert Xpress SARS-CoV-2/FLU/RSV testing.  Fact Sheet for Patients: BloggerCourse.comhttps://www.fda.gov/media/152166/download  Fact Sheet for Healthcare Providers: SeriousBroker.ithttps://www.fda.gov/media/152162/download  This test is not yet approved or cleared by the [place] FDA and has been authorized for detection and/or diagnosis of SARS-CoV-2 by FDA under an Emergency Use Authorization (EUA). This EUA will remain in effect (meaning this test can be used) for the duration of the COVID-19 declaration under Section 564(b)(1) of the Act, 21 U.S.C. section 360bbb-3(b)(1), unless the authorization is terminated or revoked.  Performed at [facility], [address] [place], [place] [address]   Blood culture (routine x 2)     Status: None (Preliminary result)   Collection Time: 06/17/21  6:41 PM   Specimen: BLOOD  Result Value Ref Range Status   Specimen Description BLOOD BLOOD RIGHT HAND  Final   Special Requests   Final    BOTTLES DRAWN AEROBIC ONLY Blood Culture results may not be optimal due to an inadequate volume of blood received in culture bottles   Culture   Final    NO GROWTH 3 DAYS Performed at [facility], [address] [place], [place] [address]    Report Status  PENDING  Incomplete  Blood culture (routine x 2)     Status: None (Preliminary result)   Collection Time: 06/17/21  9:48 PM   Specimen: BLOOD  Result Value Ref Range Status   Specimen Description BLOOD BLOOD LEFT HAND  Final   Special Requests   Final    BOTTLES DRAWN AEROBIC AND ANAEROBIC Blood Culture adequate volume   Culture  Final    NO GROWTH 3 DAYS Performed at [facility], [address] [place], [place] [address]    Report Status PENDING  Incomplete  Aerobic/Anaerobic Culture w Gram Stain (surgical/deep wound)     Status: None (Preliminary result)   Collection Time: 06/18/21  3:43 PM   Specimen: Joint, Other; Body Fluid  Result Value Ref Range Status   Specimen Description   Final    SYNOVIAL LEFT KNEE Performed at [facility], [address] [place], [place] [address]    Special Requests   Final    NONE Performed at [facility], [address] [place], [place] [address]    Gram Stain   Final    MODERATE WBC PRESENT, PREDOMINANTLY PMN NO ORGANISMS SEEN    Culture   Final    NO GROWTH < 12 HOURS Performed at [facility] [address], [place], [place] [address]    Report Status PENDING  Incomplete      Radiology Studies: No results found.  Marzetta Board, MD, PhD [facility]  Between 7 am - 7 pm I am available, please contact me via Amion (for emergencies) or Securechat (non urgent messages)  Between 7 pm - 7 am I am not available, please contact night coverage MD/APP via Amion

## 2021-06-20 NOTE — Progress Notes (Signed)
ID Pt doing better Pain left knee much better No other complaint Patient Vitals for the past 24 hrs:  BP Temp Temp src Pulse Resp SpO2  06/20/21 1544 (!) 121/91 98.2 F (36.8 C) -- 85 15 100 %  06/20/21 1159 107/62 98.3 F (36.8 C) -- 84 15 99 %  06/20/21 0806 112/64 98.5 F (36.9 C) -- 81 15 98 %  06/20/21 0523 119/80 98.3 F (36.8 C) Oral 85 18 99 %  06/19/21 2143 124/78 98.2 F (36.8 C) -- 90 16 99 %  06/19/21 1735 138/84 98.1 F (36.7 C) -- 81 15 99 %    Awake and alert Chest CTA 'HSs1s2 Left knee- dressing not removed  Labs CBC Latest Ref Rng & Units 06/19/2021 06/18/2021 06/17/2021  WBC 4.0 - 10.5 K/uL 9.0 8.8 9.8  Hemoglobin 12.0 - 15.0 g/dL 11.8(L) 12.1 13.6  Hematocrit 36.0 - 46.0 % 33.9(L) 34.8(L) 39.3  Platelets 150 - 400 K/uL 243 233 279     CMP Latest Ref Rng & Units 06/19/2021 06/18/2021 06/17/2021  Glucose 70 - 99 mg/dL 400(Q) 676(P) 950(D)  BUN 8 - 23 mg/dL 17 12 14   Creatinine 0.44 - 1.00 mg/dL 3.26 7.12  Sodium 135 - 145 mmol/L 132(L) 131(L) 129(L)  Potassium 3.5 - 5.1 mmol/L 3.7 3.3(L) 3.2(L)  Chloride 98 - 111 mmol/L 100 97(L) 93(L)  CO2 22 - 32 mmol/L 24 27 25   Calcium 8.9 - 10.3 mg/dL 8.9 9.3 9.7  Total Protein 6.5 - 8.1 g/dL 7.2 - 7.7  Total Bilirubin 0.3 - 1.2 mg/dL 0.8 - 0.9  Alkaline Phos 38 - 126 U/L 46 - 46  AST 15 - 41 U/L 15 - 27  ALT 0 - 44 U/L 11 - 12     Micro 11/22 Synovial fluid- NG 11/23 Synovial fluid culture ng  Impression/recommendation  Pt presenting with acute onset swelling and pain left knee Aspiration showed neutrophilic pleocytosis Arthroscopy done on 11/23 favors pigmented villo nodular synovitis- pathology pending Culture negative Pt is currently on IV vanco and cefepime Can be switched to cefadroxil 1 gram PO BID and bactrim DS 1 BID for 2 weeks on discharge Will follow as OP 07/03/21 at 12.15 pm Discussed the management with patient and care team

## 2021-06-20 NOTE — Progress Notes (Signed)
  Subjective:  POD #2 s/p left knee arthroscopic irrigation, debridement and synovectomy.   Patient reports left knee pain as mild.  Patient receiving IV antibiotics.  Infectious diseases following.  Cultures are negative to date.  Pathology on the left knee synovium is pending.  Objective:   VITALS:   Vitals:   06/20/21 0523 06/20/21 0806 06/20/21 1159 06/20/21 1544  BP: 119/80 112/64 107/62 (!) 121/91  Pulse: 85 81 84 85  Resp: 18 15 15 15   Temp: 98.3 F (36.8 C) 98.5 F (36.9 C) 98.3 F (36.8 C) 98.2 F (36.8 C)  TempSrc: Oral     SpO2: 99% 98% 99% 100%  Weight:      Height:        PHYSICAL EXAM: Left lower extremity: I personally change the patient's dressing today.  Her arthroscopy portal sites show no active drainage.  There is no evidence of erythema or significant effusion.  Patient can flex her knee approximately 45 degrees during her dressing change without significant pain. Neurovascular intact Sensation intact distally Intact pulses distally Dorsiflexion/Plantar flexion intact Incision: no drainage No cellulitis present Compartment soft  LABS  Results for orders placed or performed during the hospital encounter of 06/17/21 (from the past 24 hour(s))  Glucose, capillary     Status: Abnormal   Collection Time: 06/19/21  9:43 PM  Result Value Ref Range   Glucose-Capillary 252 (H) 70 - 99 mg/dL   Comment 1 Notify RN    Comment 2 Document in Chart   Glucose, capillary     Status: Abnormal   Collection Time: 06/20/21  8:09 AM  Result Value Ref Range   Glucose-Capillary 218 (H) 70 - 99 mg/dL  Glucose, capillary     Status: Abnormal   Collection Time: 06/20/21 12:07 PM  Result Value Ref Range   Glucose-Capillary 244 (H) 70 - 99 mg/dL  Glucose, capillary     Status: Abnormal   Collection Time: 06/20/21  5:11 PM  Result Value Ref Range   Glucose-Capillary 302 (H) 70 - 99 mg/dL    No results found.  Assessment/Plan: 2 Days Post-Op   Principal Problem:    Septic joint (HCC) Active Problems:   Type 2 diabetes mellitus (HCC)   Hypertension associated with diabetes (HCC)   Hypokalemia   Hyponatremia   Elevated lactic acid level   Arthritis, septic, knee (HCC)  Cultures remain negative.  Her synovial pathology is pending.  Patient continues on IV antibiotics.  It is possible the patient can be discharged home on oral antibiotics over the weekend if cultures remain negative.  She may follow-up in the office in 7 to 10 days after discharge for reevaluation.  Hopefully the pathology results will be available at that time.  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  An extensive synovectomy was performed at the time of surgery.  We will continue to follow with the hospital service.    06/22/21 , MD 06/20/2021, 5:41 PM

## 2021-06-20 NOTE — TOC Progression Note (Signed)
Transition of Care [facility]) - Progression Note    Patient Details  Name: Brooke Miles MRN: [id number] Date of Birth: 1956/04/07  Transition of Care [facility]) CM/SW Contact  Marlowe Sax, RN Phone Number: 06/20/2021, 4:40 PM  Clinical Narrative:   The patient's son is in the room with the patient, [facility] to get her a RW, She is scheduled to fly to [place] on Sunday however, they postponed the trip for 1 week so that if she needs to follow up here with the Doctors she can. I notified the Physician         Expected Discharge Plan and Services                                                 Social Determinants of Health (SDOH) Interventions    Readmission Risk Interventions No flowsheet data found.

## 2021-06-21 ENCOUNTER — Encounter: Payer: Self-pay | Admitting: Orthopedic Surgery

## 2021-06-21 LAB — CREATININE, SERUM
Creatinine, Ser: 0.69 mg/dL (ref 0.44–1.00)
GFR, Estimated: >60 mL/min (ref >60)

## 2021-06-21 LAB — GLUCOSE, CAPILLARY: Glucose-Capillary: 186 mg/dL — ABNORMAL HIGH (ref 70–99)

## 2021-06-21 LAB — BODY FLUID CULTURE W GRAM STAIN: Culture: NO GROWTH

## 2021-06-21 LAB — GLUCOSE, POCT (MANUAL RESULT ENTRY): POC Glucose: 200 mg/dl — AB (ref 70–99)

## 2021-06-21 MED ORDER — SULFAMETHOXAZOLE-TRIMETHOPRIM 800-160 MG PO TABS: 1.0000 | ORAL_TABLET | Freq: Two times a day (BID) | ORAL | 0 refills | Status: DC

## 2021-06-21 MED ORDER — CEFADROXIL 500 MG PO CAPS: 1000.0000 mg | ORAL_CAPSULE | Freq: Two times a day (BID) | ORAL | 0 refills | Status: DC

## 2021-06-21 MED ORDER — OXYCODONE HCL 5 MG PO TABS: 5.0000 mg | ORAL_TABLET | ORAL | 0 refills | Status: AC | PRN

## 2021-06-21 NOTE — Discharge Summary (Signed)
Physician Discharge Summary  Brooke Miles [id number] DOB: 1956-03-28 DOA: 06/17/2021  PCP: [facility]  Admit date: 06/17/2021 Discharge date: 06/21/2021  Admitted From: home Disposition:  home  Recommendations for Outpatient Follow-up:  Follow up with orthopedic surgery and infectious disease Please obtain BMP/CBC in one week  Home Health: PT Equipment/Devices: walker  Discharge Condition: stable CODE STATUS: Full code Diet recommendation: regulat  HPI: Per admitting MD, Brooke Miles is a 65 y.o. female with medical history significant for hyperlipidemia, non-insulin-dependent diabetes mellitus, hypertension, on anticoagulation with Eliquis, atrial fibrillation, who presents emergency department for chief concerns of left knee pain. At bedside that is hard, patient was able to meet her name, age, current location of hospital. She endorses subjective fever.  She states that the swelling has started about 3 days ago.  No trauma.  She reports this is never happened before.  She denies any chest pain, shortness of breath, headaches, neck pain, cough, abdominal pain, dysuria, hematuria, diarrhea.  Hospital Course / Discharge diagnoses: Principal Problem Concern for septic joint-gram stain was negative but had significant WBC elevation with neutrophil shift.  She underwent washout in the OR on 11/23 per orthopedic surgery.  She was maintained on IV antibiotics and infectious disease was consulted.  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  Cultures have remained negative.  ID recommends 2 weeks of cefadroxil as well as Bactrim, and will follow up with patient on 07/03/2021 at 12:15 PM.  Patient will also follow-up with orthopedic surgery as an outpatient.  She is improved, able to ambulate with a walker, home health PT was  arranged and will be discharged home in stable condition.  Active Problems NIDDM-continue home regimen on discharge Essential hypertension-continue home regimen on discharge  Hyperlipidemia-continue statin Chronic systolic CHF, CAD-continue medications as below, she appears euvolemic.  Most recent 2D echo done October 2022 shows EF 30-35% with global hypokinesis.  She underwent a cath in October 2022 which showed moderate severe noncritical CAD.  It was felt like her LVEF is out of proportion to the CAD, suggesting nonischemic cardiomyopathy Persistent A. fib-continue Coreg, Eliquis Hypokalemia-replete and monitor Hyponatremia-mild, stable, monitor.  There is some degree of chronicity to it  Sepsis ruled out   Discharge Instructions   Allergies as of 06/21/2021       Reactions   Quinolones Hives, Itching        Medication List     TAKE these medications    atorvastatin 40 MG tablet Commonly known as: LIPITOR Take 1 tablet (40 mg total) by mouth daily.   carvedilol 25 MG tablet Commonly known as: COREG Take 1 tablet (25 mg total) by mouth 2 (two) times daily with a meal.   cefadroxil 500 MG capsule Commonly known as: DURICEF Take 2 capsules (1,000 mg total) by mouth 2 (two) times daily for 14 days.   diclofenac Sodium 1 % Gel Commonly known as: VOLTAREN Apply 4 g topically 4 (four) times daily as needed for pain.   Eliquis 5 MG Tabs tablet Generic drug: apixaban Take 1 tablet (5 mg total) by mouth 2 (two) times daily. What changed: Another medication with the same name was removed. Continue taking this medication, and follow the directions you see here.   losartan 100 MG tablet Commonly known as: COZAAR Take 1 tablet (100 mg total) by mouth daily.   metFORMIN 500  MG 24 hr tablet Commonly known as: GLUCOPHAGE-XR Take 500 mg by mouth 2 (two) times daily.   oxyCODONE 5 MG immediate release tablet Commonly known as: Oxy IR/ROXICODONE Take 1-2 tablets (5-10 mg  total) by mouth every 4 (four) hours as needed for moderate pain.   spironolactone 25 MG tablet Commonly known as: ALDACTONE Take 1 tablet (25 mg total) by mouth once daily.   sulfamethoxazole-trimethoprim 800-160 MG tablet Commonly known as: BACTRIM DS Take 1 tablet by mouth every 12 (twelve) hours for 14 days.        Follow-up Information     Juanell Fairly, MD Follow up in 1 week(s).   Specialty: Orthopedic Surgery Why: call on  Monday 11/28 to set up an appointment Contact information: [address] [place] [address] [phone]                 Consultations: Orthopedic surgery ID  Procedures/Studies:  CARDIAC CATHETERIZATION  Result Date: 05/23/2021 Conclusions: Moderate-severe, noncritical coronary artery disease predominantly affecting the branch vessels, as detailed below.  This includes 80-90% D1 lesion, 60% mid LAD stenosis, and 70% distal OM2 lesion. Normal left ventricular filling pressure. Recommendations: Continue medical therapy as reduced LVEF is out of proportion to coronary artery disease, suggesting nonischemic cardiomyopathy. If no evidence of bleeding or vascular injury, restart apixaban tomorrow. Discontinue digoxin, as level is supratherapeutic today (1.7). Medical therapy and risk factor modification to prevent progression of coronary artery disease. Advance goal-directed medical therapy, as tolerated, per Dr. Azucena Cecil. Yvonne Kendall, MD [facility]  DG Chest Portable 1 View  Result Date: 06/17/2021 CLINICAL DATA:  Lactic acidosis EXAM: PORTABLE CHEST 1 VIEW COMPARISON:  None. FINDINGS: The heart size and mediastinal contours are within normal limits. Both lungs are clear. The visualized skeletal structures are unremarkable. IMPRESSION: No active disease. Electronically Signed   By: Sharlet Salina M.D.   On: 06/17/2021 18:30   DG Knee Complete 4 Views Left  Result Date: 06/17/2021 CLINICAL DATA:  Left knee pain and  swelling EXAM: LEFT KNEE - COMPLETE 4+ VIEW COMPARISON:  None. FINDINGS: No acute fracture or dislocation. Tricompartmental joint space narrowing and marginal osteophytes. Moderate to large supra patellar joint effusion. Calcification on the medial aspect of the medial femoral condyle, likely sequela of prior ligamentous injury. IMPRESSION: 1.  No acute fracture or dislocation. 2. 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 Electronically Signed   By: Larose Hires D.O.   On: 06/17/2021 14:11   MS DIGITAL SCREENING TOMO BILATERAL  Result Date: 06/10/2021 CLINICAL DATA:  Screening. EXAM: DIGITAL SCREENING BILATERAL MAMMOGRAM WITH TOMOSYNTHESIS AND CAD TECHNIQUE: Bilateral screening digital craniocaudal and mediolateral oblique mammograms were obtained. Bilateral screening digital breast tomosynthesis was performed. The images were evaluated with computer-aided detection. COMPARISON:  None. ACR Breast Density Category a: The breast tissue is almost entirely fatty. FINDINGS: There are no findings suspicious for malignancy. IMPRESSION: No mammographic evidence of malignancy. A result letter of this screening mammogram will be mailed directly to the patient. RECOMMENDATION: Screening mammogram in one year. (Code:[id number]-01Y) BI-RADS CATEGORY  1: Negative. Electronically Signed   By: Norva Pavlov M.D.   On: 06/10/2021 12:02    Subjective: - no chest pain, shortness of breath, no abdominal pain, nausea or vomiting.   Discharge Exam: BP (!) 144/78 (BP Location: Left Arm)   Pulse 82   Temp 98.2 F (36.8 C)   Resp 16   Ht 5\' 8"  (1.727 m)   Wt 86.6 kg  SpO2 99%   BMI 29.04 kg/m   General: Pt is alert, awake, not in acute distress Cardiovascular: RRR, S1/S2 +, no rubs, no gallops Respiratory: CTA bilaterally, no wheezing, no rhonchi Abdominal: Soft, NT, ND, bowel sounds + Extremities: no edema, no cyanosis    The results of significant diagnostics  from this hospitalization (including imaging, microbiology, ancillary and laboratory) are listed below for reference.     Microbiology: Recent Results (from the past 240 hour(s))  Chlamydia/NGC rt PCR ([place] only)     Status: None   Collection Time: 06/17/21  3:03 PM   Specimen: Urine  Result Value Ref Range Status   Specimen source GC/Chlam URINE, RANDOM  Final   Chlamydia Tr NOT DETECTED NOT DETECTED Final   N gonorrhoeae NOT DETECTED NOT DETECTED Final    Comment: (NOTE) This CT/NG assay has not been evaluated in patients with a history of  hysterectomy. Performed at [facility], [address] [place], [place] [address]   Body fluid culture w Gram Stain     Status: None   Collection Time: 06/17/21  3:46 PM   Specimen: KNEE; Body Fluid  Result Value Ref Range Status   Specimen Description   Final    KNEE Performed at [facility], [address] [place], [place] [address]    Special Requests   Final    NONE Performed at [facility], [address] [place], [place] [address]    Gram Stain   Final    FEW WBC PRESENT, PREDOMINANTLY MONONUCLEAR NO ORGANISMS SEEN    Culture   Final    NO GROWTH 3 DAYS Performed at [facility], [place] [address] [place], [place] [address]    Report Status 06/21/2021 FINAL  Final  Resp Panel by RT-PCR (Flu A&B, Covid) Nasopharyngeal Swab     Status: None   Collection Time: 06/17/21  6:08 PM   Specimen: Nasopharyngeal Swab; Nasopharyngeal(NP) swabs in vial transport medium  Result Value Ref Range Status   SARS Coronavirus 2 by RT PCR NEGATIVE NEGATIVE Final    Comment: (NOTE) SARS-CoV-2 target nucleic acids are NOT DETECTED.  The SARS-CoV-2 RNA is generally detectable in upper respiratory specimens during the acute phase of infection. The lowest concentration of SARS-CoV-2 viral copies this assay can detect is 138 copies/mL. A negative result does not preclude SARS-Cov-2 infection and should not be  used as the sole basis for treatment or other patient management decisions. A negative result may occur with  improper specimen collection/handling, submission of specimen other than nasopharyngeal swab, presence of viral mutation(s) within the areas targeted by this assay, and inadequate number of viral copies(<138 copies/mL). A negative result must be combined with clinical observations, patient history, and epidemiological information. The expected result is Negative.  Fact Sheet for Patients:  EntrepreneurPulse.com.au  Fact Sheet for Healthcare Providers:  IncredibleEmployment.be  This test is no t yet approved or cleared by the [place] FDA and  has been authorized for detection and/or diagnosis of SARS-CoV-2 by FDA under an Emergency Use Authorization (EUA). This EUA will remain  in effect (meaning this test can be used) for the duration of the COVID-19 declaration under Section 564(b)(1) of the Act, 21 U.S.C.section 360bbb-3(b)(1), unless the authorization is terminated  or revoked sooner.       Influenza A by PCR NEGATIVE NEGATIVE Final   Influenza B by PCR NEGATIVE NEGATIVE Final    Comment: (NOTE) The Xpert Xpress SARS-CoV-2/FLU/RSV plus assay is  intended as an aid in the diagnosis of influenza from Nasopharyngeal swab specimens and should not be used as a sole basis for treatment. Nasal washings and aspirates are unacceptable for Xpert Xpress SARS-CoV-2/FLU/RSV testing.  Fact Sheet for Patients: EntrepreneurPulse.com.au  Fact Sheet for Healthcare Providers: IncredibleEmployment.be  This test is not yet approved or cleared by the [place] FDA and has been authorized for detection and/or diagnosis of SARS-CoV-2 by FDA under an Emergency Use Authorization (EUA). This EUA will remain in effect (meaning this test can be used) for the duration of the COVID-19 declaration under Section  564(b)(1) of the Act, 21 U.S.C. section 360bbb-3(b)(1), unless the authorization is terminated or revoked.  Performed at [facility], [address] [place], [place] [address]   Blood culture (routine x 2)     Status: None (Preliminary result)   Collection Time: 06/17/21  6:41 PM   Specimen: BLOOD  Result Value Ref Range Status   Specimen Description BLOOD BLOOD RIGHT HAND  Final   Special Requests   Final    BOTTLES DRAWN AEROBIC ONLY Blood Culture results may not be optimal due to an inadequate volume of blood received in culture bottles   Culture   Final    NO GROWTH 4 DAYS Performed at [facility], [address] [place], [place] [address]    Report Status PENDING  Incomplete  Blood culture (routine x 2)     Status: None (Preliminary result)   Collection Time: 06/17/21  9:48 PM   Specimen: BLOOD  Result Value Ref Range Status   Specimen Description BLOOD BLOOD LEFT HAND  Final   Special Requests   Final    BOTTLES DRAWN AEROBIC AND ANAEROBIC Blood Culture adequate volume   Culture   Final    NO GROWTH 4 DAYS Performed at [facility], [address] [place], [place] [address]    Report Status PENDING  Incomplete  Aerobic/Anaerobic Culture w Gram Stain (surgical/deep wound)     Status: None (Preliminary result)   Collection Time: 06/18/21  3:43 PM   Specimen: Joint, Other; Body Fluid  Result Value Ref Range Status   Specimen Description   Final    SYNOVIAL LEFT KNEE Performed at [facility], [address] [place], [place] [address]    Special Requests   Final    NONE Performed at [facility], [address] [place], [place] [address]    Gram Stain   Final    MODERATE WBC PRESENT, PREDOMINANTLY PMN NO ORGANISMS SEEN    Culture   Final    NO GROWTH 2 DAYS NO ANAEROBES ISOLATED; CULTURE IN PROGRESS FOR 5 DAYS Performed at [facility] [address], [place], [place] [address]    Report Status PENDING   Incomplete     Labs: Basic Metabolic Panel: Recent Labs  Lab 06/17/21 1304 06/18/21 0330 06/19/21 0448 06/21/21 0611  NA 129* 131* 132*  --   K 3.2* 3.3* 3.7  --   CL 93* 97* 100  --   CO2 25 27 24   --   GLUCOSE 333* 208* 271*  --   BUN 14 12 17   --   CREATININE 0.93 0.73 0.98 0.69  CALCIUM 9.7 9.3 8.9  --   MG 1.3*  --   --   --    Liver Function Tests: Recent Labs  Lab 06/17/21 1304 06/19/21 0448  AST 27 15  ALT 12 11  ALKPHOS  46 46  BILITOT 0.9 0.8  PROT 7.7 7.2  ALBUMIN 3.9 3.1*   CBC: Recent Labs  Lab 06/17/21 1304 06/18/21 0330 06/19/21 0448  WBC 9.8 8.8 9.0  NEUTROABS 7.5  --   --   HGB 13.6 12.1 11.8*  HCT 39.3 34.8* 33.9*  MCV 83.8 81.7 83.1  PLT 279 233 243   CBG: Recent Labs  Lab 06/20/21 0809 06/20/21 1207 06/20/21 1711 06/20/21 2119 06/21/21 1151  GLUCAP 218* 244* 302* 250* 186*   Hgb A1c No results for input(s): HGBA1C in the last 72 hours. Lipid Profile No results for input(s): CHOL, HDL, LDLCALC, TRIG, CHOLHDL, LDLDIRECT in the last 72 hours. Thyroid function studies No results for input(s): TSH, T4TOTAL, T3FREE, THYROIDAB in the last 72 hours.  Invalid input(s): FREET3 Urinalysis    Component Value Date/Time   COLORURINE YELLOW (A) 06/17/2021 1503   APPEARANCEUR CLEAR (A) 06/17/2021 1503   LABSPEC 1.013 06/17/2021 1503   PHURINE 5.0 06/17/2021 1503   GLUCOSEU >=500 (A) 06/17/2021 1503   HGBUR NEGATIVE 06/17/2021 1503   BILIRUBINUR NEGATIVE 06/17/2021 1503   KETONESUR NEGATIVE 06/17/2021 1503   PROTEINUR 100 (A) 06/17/2021 1503   NITRITE NEGATIVE 06/17/2021 1503   LEUKOCYTESUR NEGATIVE 06/17/2021 1503    FURTHER DISCHARGE INSTRUCTIONS:   Get Medicines reviewed and adjusted: Please take all your medications with you for your next visit with your Primary MD   Laboratory/radiological data: Please request your Primary MD to go over all hospital tests and procedure/radiological results at the follow up, please ask your  Primary MD to get all Hospital records sent to his/her office.   In some cases, they will be blood work, cultures and biopsy results pending at the time of your discharge. Please request that your primary care M.D. goes through all the records of your hospital data and follows up on these results.   Also Note the following: If you experience worsening of your admission symptoms, develop shortness of breath, life threatening emergency, suicidal or homicidal thoughts you must seek medical attention immediately by calling 911 or calling your MD immediately  if symptoms less severe.   You must read complete instructions/literature along with all the possible adverse reactions/side effects for all the Medicines you take and that have been prescribed to you. Take any new Medicines after you have completely understood and accpet all the possible adverse reactions/side effects.    Do not drive when taking Pain medications or sleeping medications (Benzodaizepines)   Do not take more than prescribed Pain, Sleep and Anxiety Medications. It is not advisable to combine anxiety,sleep and pain medications without talking with your primary care practitioner   Special Instructions: If you have smoked or chewed Tobacco  in the last 2 yrs please stop smoking, stop any regular Alcohol  and or any Recreational drug use.   Wear Seat belts while driving.   Please note: You were cared for by a hospitalist during your hospital stay. Once you are discharged, your primary care physician will handle any further medical issues. Please note that NO REFILLS for any discharge medications will be authorized once you are discharged, as it is imperative that you return to your primary care physician (or establish a relationship with a primary care physician if you do not have one) for your post hospital discharge needs so that they can reassess your need for medications and monitor your lab values.  Time coordinating discharge: 40  minutes  SIGNED:  Marzetta Board, MD, PhD 06/21/2021, 12:03  PM   

## 2021-06-21 NOTE — Progress Notes (Signed)
  Subjective:  POD #3 s/p left knee arthroscopic irrigation, debridement and synovectomy.   Patient reports knee pain is improved and currently mild.    Objective:   VITALS:   Vitals:   06/20/21 1544 06/20/21 2016 06/21/21 0403 06/21/21 0727  BP: (!) 121/91 (!) 134/93 (!) 142/82 (!) 144/78  Pulse: 85 (!) 106 93 82  Resp: 15 17 18 16   Temp: 98.2 F (36.8 C) 99.2 F (37.3 C) 98.4 F (36.9 C) 98.2 F (36.8 C)  TempSrc:   Oral   SpO2: 100% 99% 100% 99%  Weight:      Height:        PHYSICAL EXAM: Left lower extremity Neurovascular intact Sensation intact distally Intact pulses distally Dorsiflexion/Plantar flexion intact Incision: dressing C/D/I No cellulitis present Compartment soft  LABS  Results for orders placed or performed during the hospital encounter of 06/17/21 (from the past 24 hour(s))  Glucose, capillary     Status: Abnormal   Collection Time: 06/20/21 12:07 PM  Result Value Ref Range   Glucose-Capillary 244 (H) 70 - 99 mg/dL  Glucose, capillary     Status: Abnormal   Collection Time: 06/20/21  5:11 PM  Result Value Ref Range   Glucose-Capillary 302 (H) 70 - 99 mg/dL  Glucose, capillary     Status: Abnormal   Collection Time: 06/20/21  9:19 PM  Result Value Ref Range   Glucose-Capillary 250 (H) 70 - 99 mg/dL  Creatinine, serum     Status: None   Collection Time: 06/21/21  6:11 AM  Result Value Ref Range   Creatinine, Ser 0.69 0.44 - 1.00 mg/dL   GFR, Estimated 06/23/21 >16 mL/min  POCT glucose (manual entry)     Status: Abnormal   Collection Time: 06/21/21  8:10 AM  Result Value Ref Range   POC Glucose 200 (A) 70 - 99 mg/dl    No results found.  Assessment/Plan: 3 Days Post-Op   Principal Problem:   Septic joint (HCC) Active Problems:   Type 2 diabetes mellitus (HCC)   Hypertension associated with diabetes (HCC)   Hypokalemia   Hyponatremia   Elevated lactic acid level   Arthritis, septic, knee (HCC)  Patient is improving postop.  Blood  cultures and synovial fluid cultures are negative to date.  Patient has been seen by infectious disease.  Infectious disease specialist has recommended 2 weeks of oral antibiotics upon discharge.  Therapist has recommended home health PT upon discharge.  Patient may be discharged from orthopedic standpoint when she is cleared medically.  Patient may follow-up in our office in 1 week for reevaluation of her left knee.  I will continue to follow synovial fluid cultures and synovial biopsy pathology results.    06/23/21 , MD 06/21/2021, 11:43 AM

## 2021-06-22 LAB — CULTURE, BLOOD (ROUTINE X 2)
Culture: NO GROWTH
Culture: NO GROWTH
Special Requests: ADEQUATE

## 2021-06-23 ENCOUNTER — Other Ambulatory Visit: Payer: Self-pay

## 2021-06-23 LAB — AEROBIC/ANAEROBIC CULTURE W GRAM STAIN (SURGICAL/DEEP WOUND): Culture: NO GROWTH

## 2021-06-23 NOTE — Anesthesia Postprocedure Evaluation (Signed)
Anesthesia Post Note  Patient: Brooke Miles  Procedure(s) Performed: ARTHROSCOPY KNEE (Left: Knee) INCISION AND DRAINAGE (Left: Knee)  Patient location during evaluation: PACU Anesthesia Type: General Level of consciousness: awake and alert, oriented and patient cooperative Pain management: pain level controlled Vital Signs Assessment: post-procedure vital signs reviewed and stable Respiratory status: spontaneous breathing, nonlabored ventilation and respiratory function stable Cardiovascular status: blood pressure returned to baseline and stable Postop Assessment: adequate PO intake Anesthetic complications: no   No notable events documented.   Last Vitals:  Vitals:   06/21/21 0403 06/21/21 0727  BP: (!) 142/82 (!) 144/78  Pulse: 93 82  Resp: 18 16  Temp: 36.9 C 36.8 C  SpO2: 100% 99%    Last Pain:  Vitals:   06/21/21 0840  TempSrc:   PainSc: 0-No pain                 Reed Breech

## 2021-06-24 LAB — SURGICAL PATHOLOGY

## 2021-06-25 ENCOUNTER — Other Ambulatory Visit: Payer: Self-pay

## 2021-06-25 LAB — GLUCOSE, CAPILLARY
Comment 1: 149
Glucose-Capillary: 200 mg/dL — ABNORMAL HIGH (ref 70–99)

## 2021-06-26 ENCOUNTER — Other Ambulatory Visit: Payer: Self-pay

## 2021-06-26 ENCOUNTER — Ambulatory Visit: Payer: Self-pay | Attending: Infectious Diseases | Admitting: Infectious Diseases

## 2021-06-26 ENCOUNTER — Other Ambulatory Visit
Admission: RE | Admit: 2021-06-26 | Discharge: 2021-06-26 | Disposition: A | Discharge status: Home / Self Care | Payer: Self-pay | Source: Ambulatory Visit | Attending: Infectious Diseases | Admitting: Infectious Diseases

## 2021-06-26 ENCOUNTER — Telehealth: Payer: Self-pay

## 2021-06-26 VITALS — BP 138/84 | HR 112 | Resp 16 | Ht 69.0 in | Wt 191.0 lb

## 2021-06-26 DIAGNOSIS — M1711 Unilateral primary osteoarthritis, right knee: Secondary | ICD-10-CM | POA: Insufficient documentation

## 2021-06-26 DIAGNOSIS — Z7984 Long term (current) use of oral hypoglycemic drugs: Secondary | ICD-10-CM | POA: Insufficient documentation

## 2021-06-26 DIAGNOSIS — E119 Type 2 diabetes mellitus without complications: Secondary | ICD-10-CM | POA: Insufficient documentation

## 2021-06-26 DIAGNOSIS — M009 Pyogenic arthritis, unspecified: Secondary | ICD-10-CM | POA: Insufficient documentation

## 2021-06-26 DIAGNOSIS — I509 Heart failure, unspecified: Secondary | ICD-10-CM | POA: Insufficient documentation

## 2021-06-26 DIAGNOSIS — I110 Hypertensive heart disease with heart failure: Secondary | ICD-10-CM | POA: Insufficient documentation

## 2021-06-26 DIAGNOSIS — Z79899 Other long term (current) drug therapy: Secondary | ICD-10-CM | POA: Insufficient documentation

## 2021-06-26 DIAGNOSIS — I4891 Unspecified atrial fibrillation: Secondary | ICD-10-CM | POA: Insufficient documentation

## 2021-06-26 LAB — COMPREHENSIVE METABOLIC PANEL
ALT: 15 U/L (ref 0–44)
AST: 20 U/L (ref 15–41)
Albumin: 3.5 g/dL (ref 3.5–5.0)
Alkaline Phosphatase: 70 U/L (ref 38–126)
Anion gap: 7 (ref 5–15)
BUN: 20 mg/dL (ref 8–23)
CO2: 26 mmol/L (ref 22–32)
Calcium: 10.2 mg/dL (ref 8.9–10.3)
Chloride: 96 mmol/L — ABNORMAL LOW (ref 98–111)
Creatinine, Ser: 1.22 mg/dL — ABNORMAL HIGH (ref 0.44–1.00)
GFR, Estimated: 50 mL/min — ABNORMAL LOW (ref >60)
Glucose, Bld: 181 mg/dL — ABNORMAL HIGH (ref 70–99)
Potassium: 4.9 mmol/L (ref 3.5–5.1)
Sodium: 129 mmol/L — ABNORMAL LOW (ref 135–145)
Total Bilirubin: 0.7 mg/dL (ref 0.3–1.2)
Total Protein: 8.1 g/dL (ref 6.5–8.1)

## 2021-06-26 LAB — CBC WITH DIFFERENTIAL/PLATELET
Abs Immature Granulocytes: 0.02 10*3/uL (ref 0.00–0.07)
Basophils Absolute: 0.1 10*3/uL (ref 0.0–0.1)
Basophils Relative: 1 %
Eosinophils Absolute: 0 10*3/uL (ref 0.0–0.5)
Eosinophils Relative: 0 %
HCT: 36.9 % (ref 36.0–46.0)
Hemoglobin: 12.7 g/dL (ref 12.0–15.0)
Immature Granulocytes: 0 %
Lymphocytes Relative: 39 %
Lymphs Abs: 3.5 10*3/uL (ref 0.7–4.0)
MCH: 28.3 pg (ref 26.0–34.0)
MCHC: 34.4 g/dL (ref 30.0–36.0)
MCV: 82.2 fL (ref 80.0–100.0)
Monocytes Absolute: 0.5 10*3/uL (ref 0.1–1.0)
Monocytes Relative: 6 %
Neutro Abs: 4.8 10*3/uL (ref 1.7–7.7)
Neutrophils Relative %: 54 %
Platelets: 464 10*3/uL — ABNORMAL HIGH (ref 150–400)
RBC: 4.49 MIL/uL (ref 3.87–5.11)
RDW: 13.9 % (ref 11.5–15.5)
WBC: 9 10*3/uL (ref 4.0–10.5)
nRBC: 0 % (ref 0.0–0.2)

## 2021-06-26 LAB — SEDIMENTATION RATE: Sed Rate: 77 mm/hr — ABNORMAL HIGH (ref 0–30)

## 2021-06-26 LAB — C-REACTIVE PROTEIN: CRP: 3.7 mg/dL — ABNORMAL HIGH (ref <1.0)

## 2021-06-26 MED ORDER — DOXYCYCLINE MONOHYDRATE 50 MG PO CAPS
100.0000 mg | ORAL_CAPSULE | Freq: Two times a day (BID) | ORAL | 0 refills | Status: AC
  Filled 2021-06-26: qty 56, 14d supply, fill #0

## 2021-06-26 MED ORDER — DICLOFENAC SODIUM 1 % EX GEL
2.0000 g | Freq: Two times a day (BID) | CUTANEOUS | 0 refills | Status: AC
Start: 2021-06-26 — End: ?
  Filled 2021-06-26: qty 100, 25d supply, fill #0

## 2021-06-26 NOTE — Progress Notes (Signed)
NAME: Brooke Miles  DOB: Oct 02, 1955  MRN: [id number]  Date/Time: 06/26/2021 11:48 AM  Subjective:   ? Brooke Miles is a 65 y.o. female with a history of diabetes mellitus, hypertension, A. fib, CHF originally from [place]  is here for follow-up after recent hospitalization for left knee joint swelling and pain of acute onset. Patient was in the hospital between 06/17/2021 and 06/21/2021. She underwent arthrocentesis on 06/17/2021 and the fluid had 46,445 WBC with 98% neutrophils.  There were no crystals.  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 Pathology was sent.  Patient was discharged on p.o. cefadroxil and p.o. Bactrim. Today she is here for follow-up with her son. She still has pain in her left knee.  Still has swelling.  Is walking with a walker. She is traveling to [place] next week to be with her husband who has got cancer.  Patient is not aware of his critical status. The synovial tissue sent for culture is also negative.  There is no AFB, fungal stains of also remain negative. 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  Past Medical History:  Diagnosis Date   Diabetes mellitus without complication (HCC)    Hyperlipidemia    Hypertension     Past Surgical History:  Procedure Laterality Date   CESAREAN SECTION     INCISION AND DRAINAGE Left 06/18/2021   Procedure: INCISION AND DRAINAGE;  Surgeon: Juanell Fairly, MD;  Location: [facility] ORS;  Service: Orthopedics;  Laterality: Left;   KNEE ARTHROSCOPY Left 06/18/2021   Procedure: ARTHROSCOPY KNEE;  Surgeon: Juanell Fairly, MD;  Location: [facility] ORS;  Service: Orthopedics;  Laterality: Left;   LEFT HEART CATH AND CORONARY ANGIOGRAPHY N/A 05/23/2021   Procedure: LEFT HEART CATH AND CORONARY ANGIOGRAPHY;  Surgeon: Yvonne Kendall, MD;  Location: [facility]  INVASIVE CV LAB;  Service: Cardiovascular;  Laterality: N/A;    Social History   Socioeconomic History   Marital status: Married    Spouse name: Not on file   Number of children: Not on file   Years of education: Not on file   Highest education level: Not on file  Occupational History   Not on file  Tobacco Use   Smoking status: Never   Smokeless tobacco: Never  Substance and Sexual Activity   Alcohol use: Not Currently   Drug use: Not on file   Sexual activity: Not on file  Other Topics Concern   Not on file  Social History Narrative   Not on file   Social Determinants of Health   Financial Resource Strain: Not on file  Food Insecurity: Not on file  Transportation Needs: Not on file  Physical Activity: Not on file  Stress: Not on file  Social Connections: Not on file  Intimate Partner Violence: Not on file    No family history on file. Allergies  Allergen Reactions   Quinolones Hives and Itching   I? Current Outpatient Medications  Medication Sig Dispense Refill   atorvastatin (LIPITOR) 40 MG tablet Take 1 tablet (40 mg total) by mouth daily. 90 tablet 1   carvedilol (COREG) 25 MG tablet Take 1 tablet (25 mg total) by mouth 2 (two) times daily with a meal. 60 tablet 5   cefadroxil (DURICEF) 500 MG capsule Take 2 capsules (1,000 mg total) by mouth 2 (two) times daily for 14 days. 56 capsule 0   diclofenac Sodium (VOLTAREN) 1 %  GEL Apply 4 g topically 4 (four) times daily as needed for pain.     losartan (COZAAR) 100 MG tablet Take 1 tablet (100 mg total) by mouth daily. 90 tablet 1   metFORMIN (GLUCOPHAGE-XR) 500 MG 24 hr tablet Take 500 mg by mouth 2 (two) times daily.     oxyCODONE (OXY IR/ROXICODONE) 5 MG immediate release tablet Take 1-2 tablets (5-10 mg total) by mouth every 4 (four) hours as needed for moderate pain. 30 tablet 0   spironolactone (ALDACTONE) 25 MG tablet Take 1 tablet (25 mg total) by mouth once daily. 90 tablet 1   sulfamethoxazole-trimethoprim  (BACTRIM DS) 800-160 MG tablet Take 1 tablet by mouth every 12 (twelve) hours for 14 days. 28 tablet 0   apixaban (ELIQUIS) 5 MG TABS tablet Take 1 tablet (5 mg total) by mouth 2 (two) times daily. (Patient not taking: Reported on 06/17/2021) 180 tablet 1   No current facility-administered medications for this visit.     Abtx:  Anti-infectives (From admission, onward)    None       REVIEW OF SYSTEMS:  Const: negative fever, negative chills, negative weight loss Eyes: negative diplopia or visual changes, negative eye pain ENT: negative coryza, negative sore throat Resp: negative cough, hemoptysis, dyspnea Cards: negative for chest pain, palpitations, lower extremity edema GU: negative for frequency, dysuria and hematuria GI: Negative for abdominal pain, diarrhea, bleeding, constipation.  As per her son she is not eating well.  But the patient says she is eating and she does not have loss of appetite.  Skin: negative for rash and pruritus Heme: negative for easy bruising and gum/nose bleeding MS: Left knee swelling and pain Neurolo:negative for headaches, dizziness, vertigo, memory problems  Psych: negative for feelings of anxiety, depression  Endocrine:  diabetes Allergy/Immunology- quinolone ?  Objective:  VITALS:  BP 138/84   Pulse (!) 112   Resp 16   Ht 5\' 9"  (1.753 m)   Wt 191 lb (86.6 kg)   SpO2 98%   BMI 28.21 kg/m  PHYSICAL EXAM:  General: Alert, cooperative, some distress., appears stated age.  Head: Normocephalic, without obvious abnormality, atraumatic. Eyes: Conjunctivae clear, anicteric sclerae. Pupils are equal ENT Nares normal. No drainage or sinus tenderness. Lips, mucosa, and tongue normal. No Thrush Neck: Supple, symmetrical, no adenopathy, thyroid: non tender no carotid bruit and no JVD. Back: No CVA tenderness. Lungs: Clear to auscultation bilaterally. No Wheezing or Rhonchi. No rales. Heart: Irregular.   Abdomen: Soft, non-tender,not distended.  Bowel sounds normal. No masses Extremities: Left knee swollen, warmth, sutures in place.  Skin: No rashes or lesions. Or bruising Lymph: Cervical, supraclavicular normal. Neurologic: Grossly non-focal Pertinent Labs Lab Results CBC    Component Value Date/Time   WBC 9.0 06/19/2021 0448   RBC 4.08 06/19/2021 0448   HGB 11.8 (L) 06/19/2021 0448   HCT 33.9 (L) 06/19/2021 0448   PLT 243 06/19/2021 0448   MCV 83.1 06/19/2021 0448   MCH 28.9 06/19/2021 0448   MCHC 34.8 06/19/2021 0448   RDW 13.9 06/19/2021 0448   LYMPHSABS 1.8 06/17/2021 1304   MONOABS 0.5 06/17/2021 1304   EOSABS 0.0 06/17/2021 1304   BASOSABS 0.0 06/17/2021 1304    CMP Latest Ref Rng & Units 06/21/2021 06/19/2021 06/18/2021  Glucose 70 - 99 mg/dL - 271(H) 208(H)  BUN 8 - 23 mg/dL - 17 12  Creatinine 0.44 - 1.00 mg/dL 0.69 0.98 0.73  Sodium 135 - 145 mmol/L - 132(L) 131(L)  Potassium 3.5 -  5.1 mmol/L - 3.7 3.3(L)  Chloride 98 - 111 mmol/L - 100 97(L)  CO2 22 - 32 mmol/L - 24 27  Calcium 8.9 - 10.3 mg/dL - 8.9 9.3  Total Protein 6.5 - 8.1 g/dL - 7.2 -  Total Bilirubin 0.3 - 1.2 mg/dL - 0.8 -  Alkaline Phos 38 - 126 U/L - 46 -  AST 15 - 41 U/L - 15 -  ALT 0 - 44 U/L - 11 -      Microbiology: Recent Results (from the past 240 hour(s))  Chlamydia/NGC rt PCR ([facility] only)     Status: None   Collection Time: 06/17/21  3:03 PM   Specimen: Urine  Result Value Ref Range Status   Specimen source GC/Chlam URINE, RANDOM  Final   Chlamydia Tr NOT DETECTED NOT DETECTED Final   N gonorrhoeae NOT DETECTED NOT DETECTED Final    Comment: (NOTE) This CT/NG assay has not been evaluated in patients with a history of  hysterectomy. Performed at [facility], [address] [place], [place] [address]   Body fluid culture w Gram Stain     Status: None   Collection Time: 06/17/21  3:46 PM   Specimen: KNEE; Body Fluid  Result Value Ref Range Status   Specimen Description   Final    KNEE Performed at [facility], [address] [place], [place] [address]    Special Requests   Final    NONE Performed at [facility], [address] [place], [place] [address]    Gram Stain   Final    FEW WBC PRESENT, PREDOMINANTLY MONONUCLEAR NO ORGANISMS SEEN    Culture   Final    NO GROWTH 3 DAYS Performed at [facility], [place] [address], [place],  [address]    Report Status 06/21/2021 FINAL  Final  Resp Panel by RT-PCR (Flu A&B, Covid) Nasopharyngeal Swab     Status: None   Collection Time: 06/17/21  6:08 PM   Specimen: Nasopharyngeal Swab; Nasopharyngeal(NP) swabs in vial transport medium  Result Value Ref Range Status   SARS Coronavirus 2 by RT PCR NEGATIVE NEGATIVE Final    Comment: (NOTE) SARS-CoV-2 target nucleic acids are NOT DETECTED.  The SARS-CoV-2 RNA is generally detectable in upper respiratory specimens during the acute phase of infection. The lowest concentration of SARS-CoV-2 viral copies this assay can detect is 138 copies/mL. A negative result does not preclude SARS-Cov-2 infection and should not be used as the sole basis for treatment or other patient management decisions. A negative result may occur with  improper specimen collection/handling, submission of specimen other than nasopharyngeal swab, presence of viral mutation(s) within the areas targeted by this assay, and inadequate number of viral copies(<138 copies/mL). A negative result must be combined with clinical observations, patient history, and epidemiological information. The expected result is Negative.  Fact Sheet for Patients:  EntrepreneurPulse.com.au  Fact Sheet for Healthcare Providers:  IncredibleEmployment.be  This test is no t yet approved or cleared by the [place] FDA and  has been authorized for detection and/or diagnosis of SARS-CoV-2 by FDA under an Emergency Use Authorization (EUA). This EUA will remain  in effect (meaning  this test can be used) for the duration of the COVID-19 declaration under Section 564(b)(1) of the Act, 21 U.S.C.section 360bbb-3(b)(1), unless the authorization is terminated  or revoked sooner.       Influenza A by PCR NEGATIVE NEGATIVE Final   Influenza B by PCR NEGATIVE  NEGATIVE Final    Comment: (NOTE) The Xpert Xpress SARS-CoV-2/FLU/RSV plus assay is intended as an aid in the diagnosis of influenza from Nasopharyngeal swab specimens and should not be used as a sole basis for treatment. Nasal washings and aspirates are unacceptable for Xpert Xpress SARS-CoV-2/FLU/RSV testing.  Fact Sheet for Patients: EntrepreneurPulse.com.au  Fact Sheet for Healthcare Providers: IncredibleEmployment.be  This test is not yet approved or cleared by the [place] FDA and has been authorized for detection and/or diagnosis of SARS-CoV-2 by FDA under an Emergency Use Authorization (EUA). This EUA will remain in effect (meaning this test can be used) for the duration of the COVID-19 declaration under Section 564(b)(1) of the Act, 21 U.S.C. section 360bbb-3(b)(1), unless the authorization is terminated or revoked.  Performed at [facility], [address] [place], [place] [address]   Blood culture (routine x 2)     Status: None   Collection Time: 06/17/21  6:41 PM   Specimen: BLOOD  Result Value Ref Range Status   Specimen Description BLOOD BLOOD RIGHT HAND  Final   Special Requests   Final    BOTTLES DRAWN AEROBIC ONLY Blood Culture results may not be optimal due to an inadequate volume of blood received in culture bottles   Culture   Final    NO GROWTH 5 DAYS Performed at [facility], [address], [place], [place] [address]    Report Status 06/22/2021 FINAL  Final  Blood culture (routine x 2)     Status: None   Collection Time: 06/17/21  9:48 PM   Specimen: BLOOD  Result Value Ref Range Status   Specimen Description  BLOOD BLOOD LEFT HAND  Final   Special Requests   Final    BOTTLES DRAWN AEROBIC AND ANAEROBIC Blood Culture adequate volume   Culture   Final    NO GROWTH 5 DAYS Performed at [facility], [address] [place], [place] [address]    Report Status 06/22/2021 FINAL  Final  Aerobic/Anaerobic Culture w Gram Stain (surgical/deep wound)     Status: None   Collection Time: 06/18/21  3:43 PM   Specimen: Joint, Other; Body Fluid  Result Value Ref Range Status   Specimen Description   Final    SYNOVIAL LEFT KNEE Performed at [facility], [address], [place], [place] [address]    Special Requests   Final    NONE Performed at [facility], [address] [place], [place] [address]    Gram Stain   Final    MODERATE WBC PRESENT, PREDOMINANTLY PMN NO ORGANISMS SEEN    Culture   Final    No growth aerobically or anaerobically. Performed at [facility], [place] [place], [place] [address]    Report Status 06/23/2021 FINAL  Final    IMAGING RESULTS: I have personally reviewed the films ? Impression/Recommendation Right knee inflammatory arthritis.  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  Pigmented nodular synovitis was questioned.  Patient will have to follow-up with orthopedics regarding this. She is currently on cefadroxil and will and Bactrim.  We will check labs today and if the creatinine is or potassium.    Diabetes mellitus on metformin Hypertension on losartan, spironolactone  Patient is going to [place] next Tuesday. She will see the orthopedic surgeon before that.  Discussed the management with the patient and her son in detail.  [facility] Labs came back and creatinine is 1.29.  So we will stop the Bactrim and cefadroxil and change it to doxycycline 100 mg p.o. twice daily for 2 weeks  _Called and informed  patient's son.  _____________________________________________ Discussed with patient, requesting provider Note:  This document was prepared using Dragon voice recognition software and may include unintentional dictation errors.

## 2021-06-26 NOTE — Patient Instructions (Addendum)
You are here for follow up of the left knee joint swelling- we have been treating like an infection but the cultures are all normal with no bacteria. You are taking sulfa and cefadroxil- will get labs today to see how your kidney and potassium are doing-and if they are normal will continue, if not will give you a different antibiotic.

## 2021-06-26 NOTE — Telephone Encounter (Signed)
Per DR Rivka Safer: Contacted son advised due to the creatinine being at 1.29 she needs to STOP both abx (Bactrim and Cephadroxyl)  he repeated back to me and understands plan. Also advised we sent Doxycycline to take 2 times daily x 2 weeks to [facility] along with the water and gel.

## 2021-06-27 ENCOUNTER — Other Ambulatory Visit: Payer: Self-pay

## 2021-07-03 ENCOUNTER — Inpatient Hospital Stay: Payer: Self-pay | Admitting: Infectious Diseases

## 2021-08-15 ENCOUNTER — Other Ambulatory Visit: Payer: Self-pay

## 2021-08-18 ENCOUNTER — Other Ambulatory Visit: Payer: Self-pay

## 2021-10-13 ENCOUNTER — Ambulatory Visit: Payer: Self-pay | Admitting: Cardiology

## 2021-10-14 ENCOUNTER — Encounter: Payer: Self-pay | Admitting: Cardiology

## 2021-11-06 ENCOUNTER — Telehealth: Payer: Self-pay | Admitting: Pharmacy Technician

## 2021-11-06 ENCOUNTER — Other Ambulatory Visit: Payer: Self-pay

## 2021-11-06 NOTE — Telephone Encounter (Signed)
Patient's son, Zyannah Burlew indicated that patient will not be eligible for Medicare until sometime in 2024.  Patient is a legal resident and has to be in the [place] as a legal resident for 5 years to qualify for Medicare benefits.   ? ?Jacquelynn Cree ?Care Manager ?[facility] ?

## 2021-11-26 ENCOUNTER — Other Ambulatory Visit: Payer: Self-pay

## 2021-11-27 ENCOUNTER — Other Ambulatory Visit: Payer: Self-pay

## 2022-05-29 ENCOUNTER — Other Ambulatory Visit: Payer: Self-pay

## 2022-12-09 ENCOUNTER — Other Ambulatory Visit: Payer: Self-pay

## 2022-12-26 IMAGING — MG MM DIGITAL SCREENING BILAT W/ TOMO AND CAD
6 of 10 series · 6 of 30 positions shown · non-contrast
Comparison: None.

ACR Breast Density Category a: The breast tissue is almost entirely
fatty.

CLINICAL DATA: Screening.

EXAM:
DIGITAL SCREENING BILATERAL MAMMOGRAM WITH TOMOSYNTHESIS AND CAD
TECHNIQUE: Bilateral screening digital craniocaudal and mediolateral oblique
mammograms were obtained. Bilateral screening digital breast
tomosynthesis was performed. The images were evaluated with
computer-aided detection.

[L CC synth-2D]
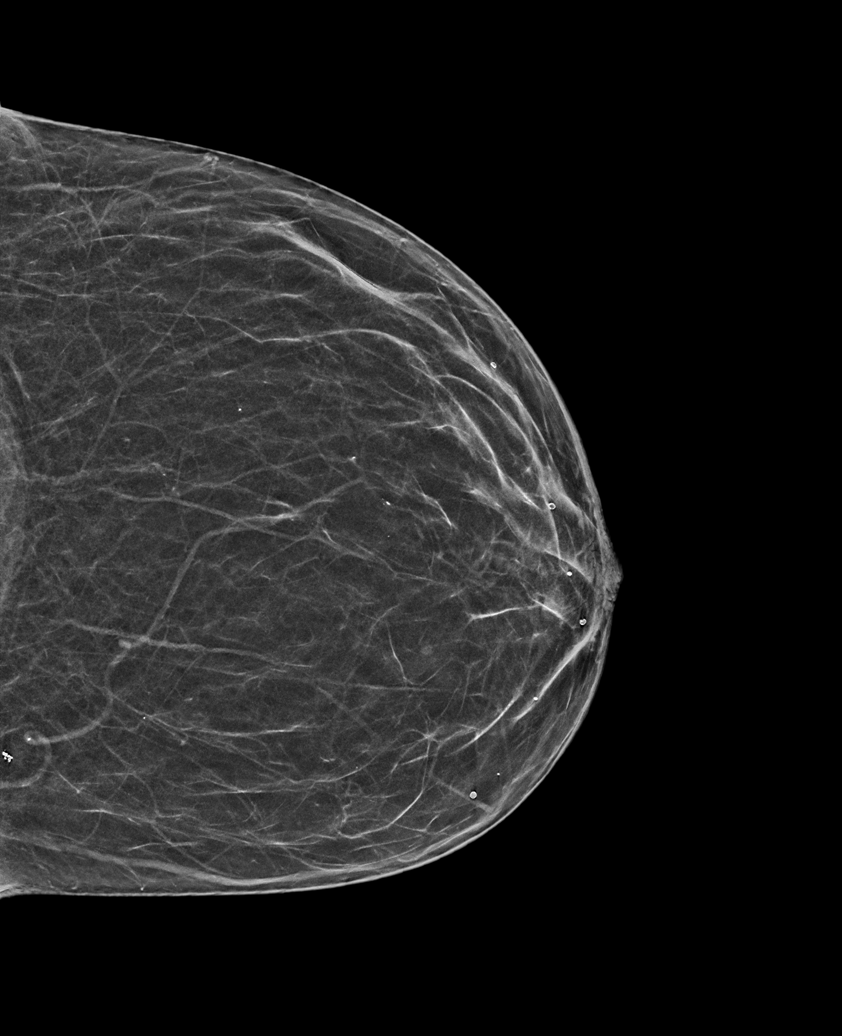

[R CC synth-2D (1 of 2)]
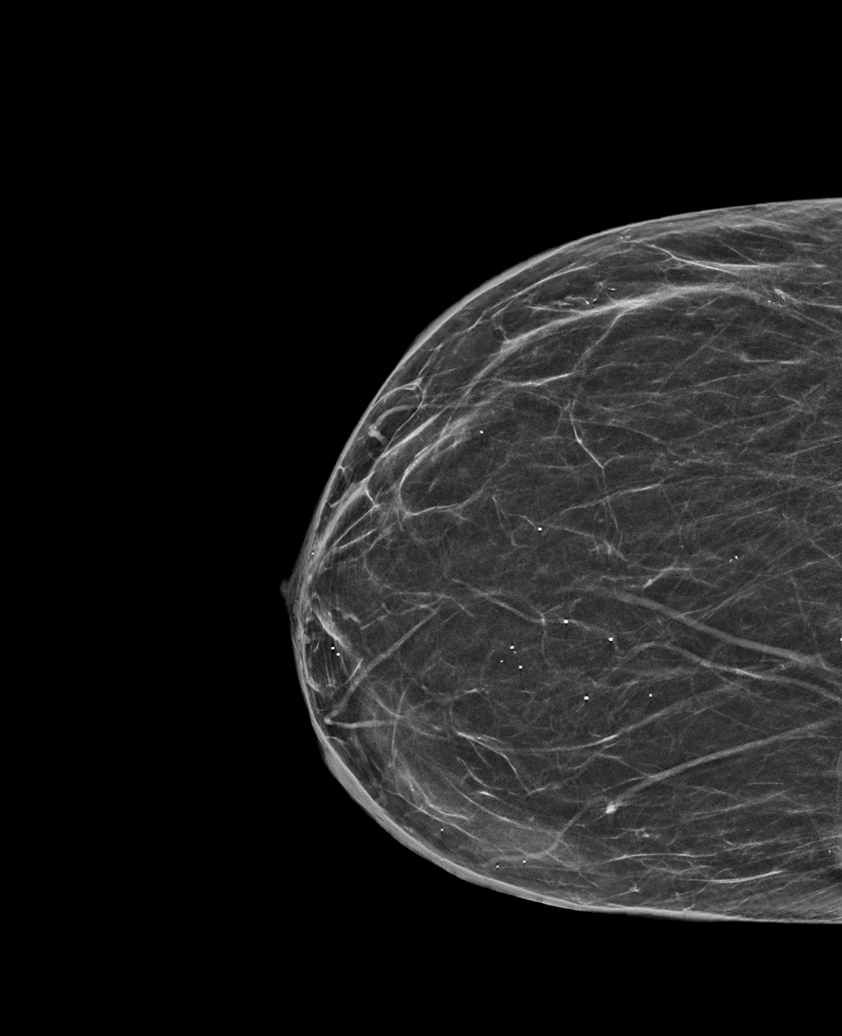

[R CC synth-2D (2 of 2)]
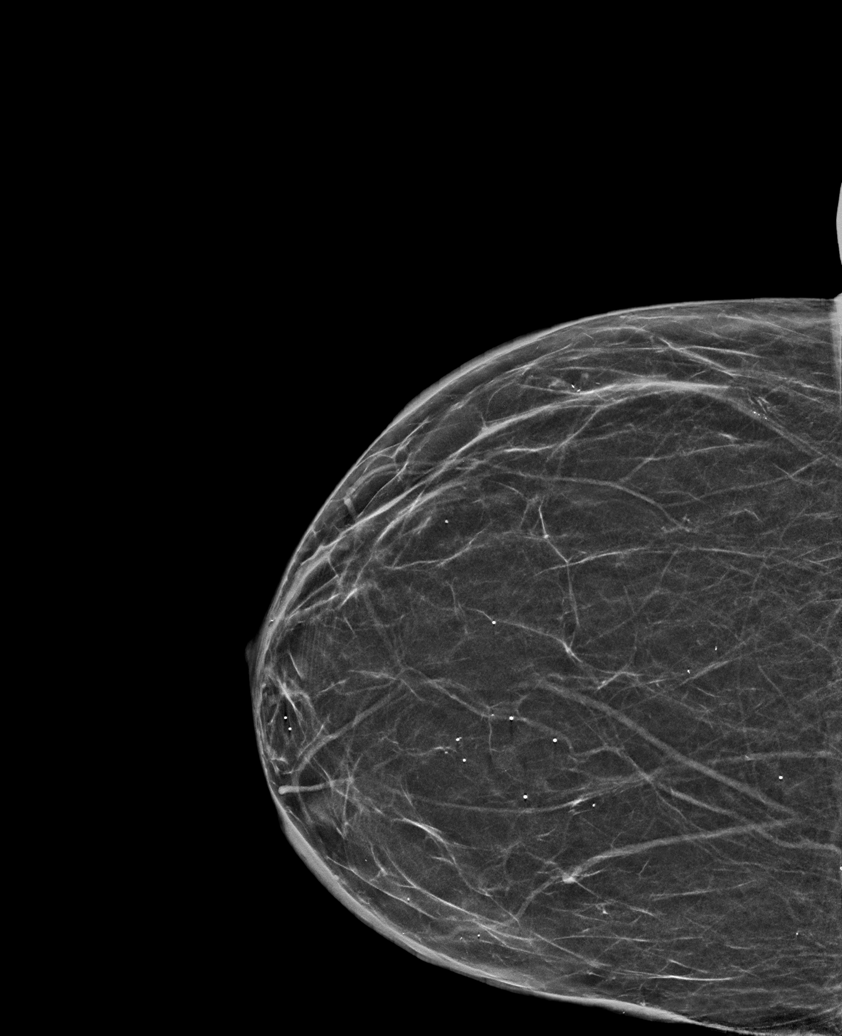

[R MLO synth-2D]
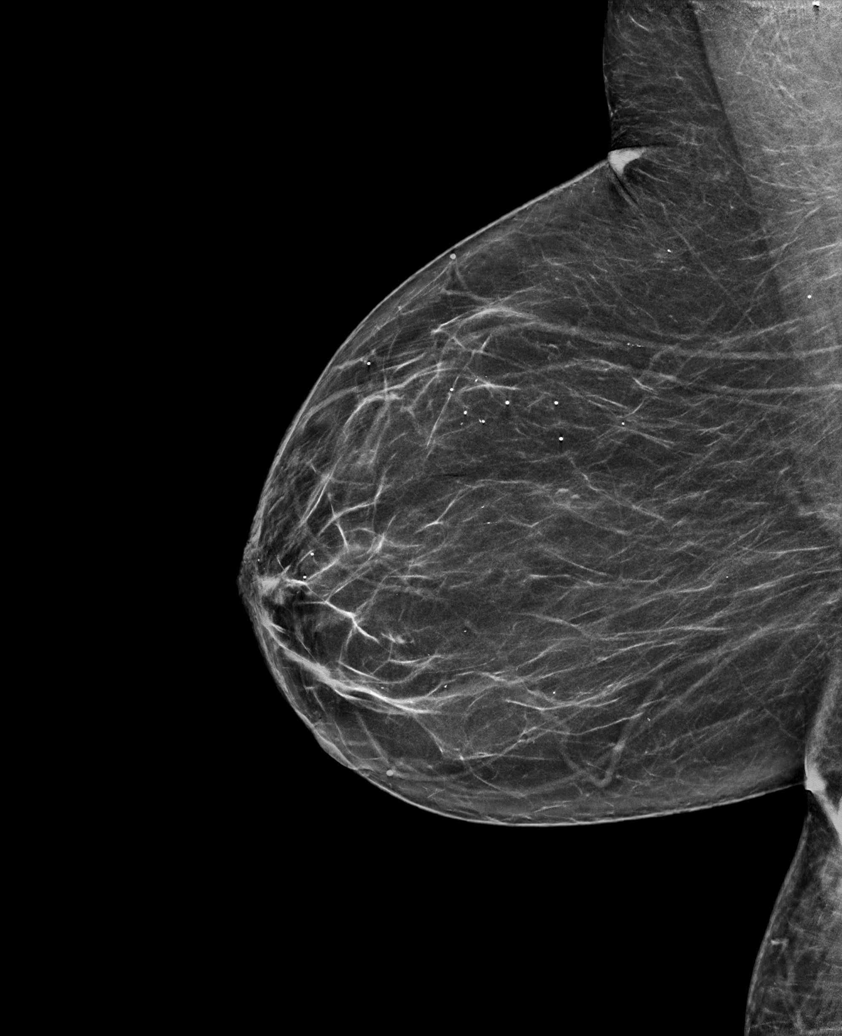

[L MLO synth-2D]
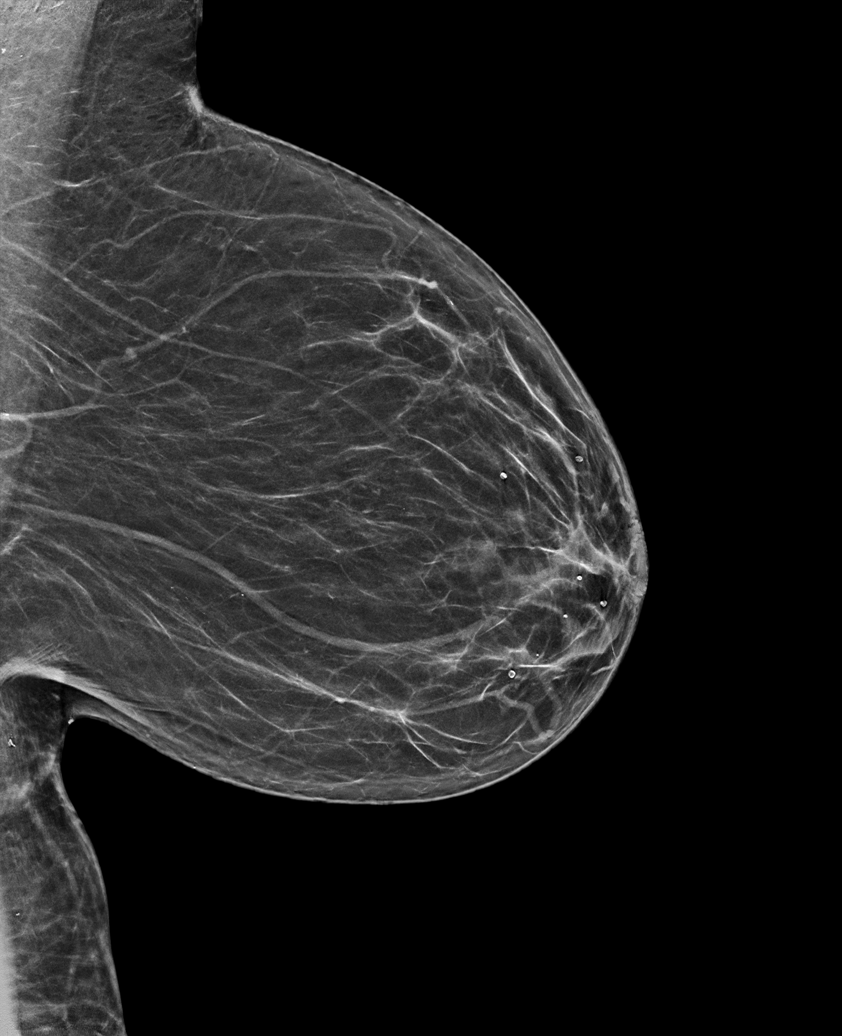

[R CC tomo · tomo slice 27/52.0]
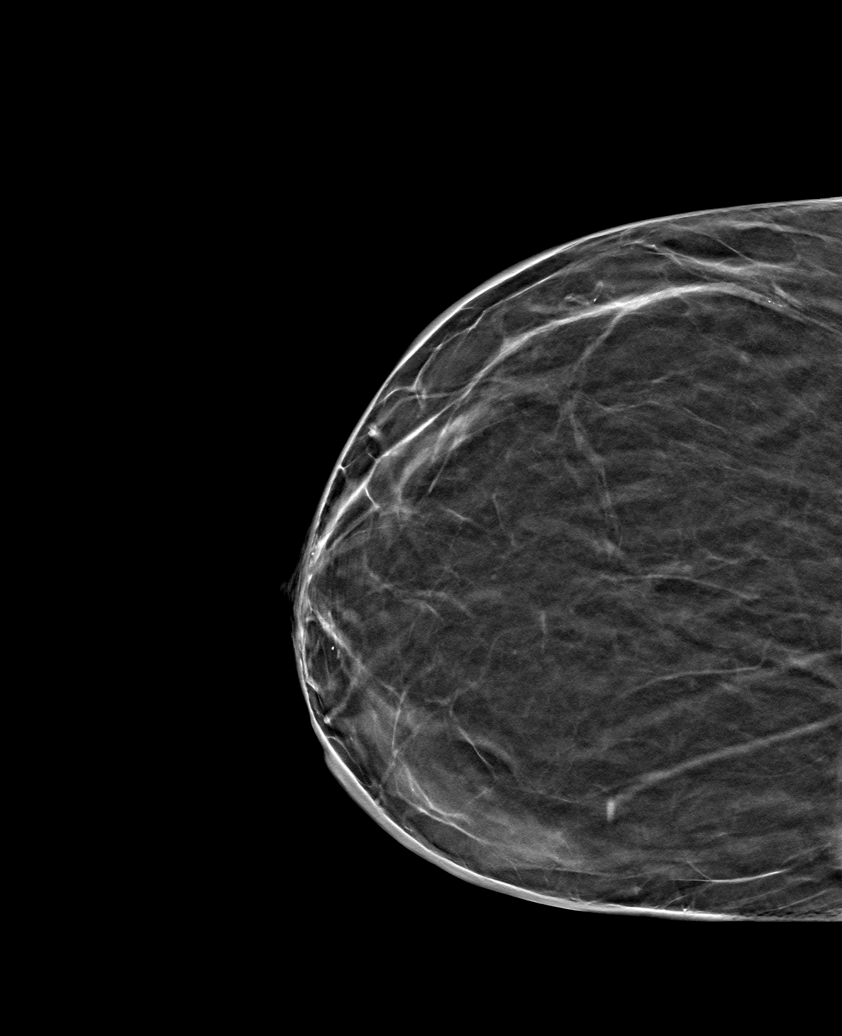

[6 of 30 positions shown; findings below may reference images not displayed]

FINDINGS: There are no findings suspicious for malignancy.
IMPRESSION: No mammographic evidence of malignancy. A result letter of this
screening mammogram will be mailed directly to the patient.

RECOMMENDATION:
Screening mammogram in one year. (Code:44-M-M6Q)

BI-RADS CATEGORY  1: Negative.

## 2023-01-02 IMAGING — DX DG CHEST 1V PORT
1 series · 1 of 1 positions shown · non-contrast
Comparison: None.

CLINICAL DATA: Lactic acidosis

EXAM:
PORTABLE CHEST 1 VIEW

[chest ap]
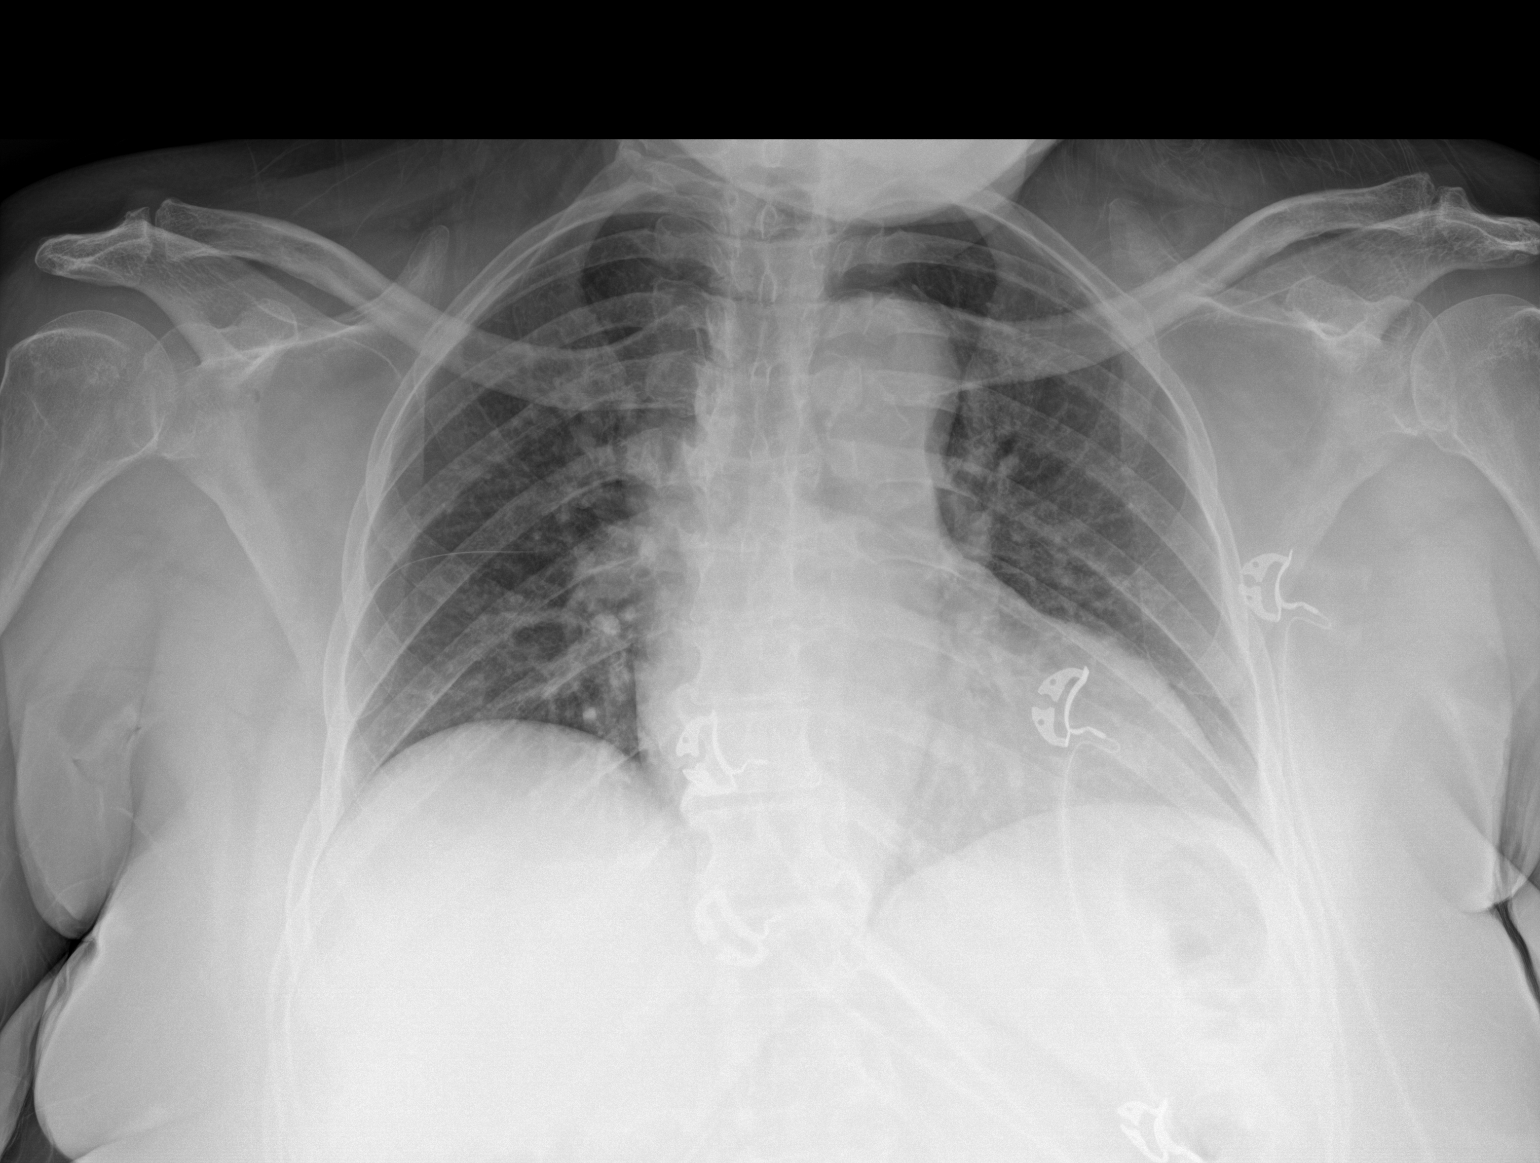

[1 of 1 positions shown; findings below may reference images not displayed]

FINDINGS: The heart size and mediastinal contours are within normal limits.
Both lungs are clear. The visualized skeletal structures are
unremarkable.
IMPRESSION: No active disease.
